# Patient Record
Sex: Male | Born: 1975
Health system: Southern US, Community
[De-identification: ages and names within clinical notes are randomized; demographics above are authoritative.]

## PROBLEM LIST (undated history)

## (undated) DIAGNOSIS — F32A Depression, unspecified: Secondary | ICD-10-CM

## (undated) DIAGNOSIS — M1990 Unspecified osteoarthritis, unspecified site: Secondary | ICD-10-CM

## (undated) DIAGNOSIS — F329 Major depressive disorder, single episode, unspecified: Secondary | ICD-10-CM

## (undated) DIAGNOSIS — I776 Arteritis, unspecified: Secondary | ICD-10-CM

## (undated) DIAGNOSIS — G589 Mononeuropathy, unspecified: Secondary | ICD-10-CM

## (undated) DIAGNOSIS — K5790 Diverticulosis of intestine, part unspecified, without perforation or abscess without bleeding: Secondary | ICD-10-CM

## (undated) DIAGNOSIS — M940 Chondrocostal junction syndrome [Tietze]: Secondary | ICD-10-CM

## (undated) DIAGNOSIS — K8020 Calculus of gallbladder without cholecystitis without obstruction: Secondary | ICD-10-CM

## (undated) HISTORY — DX: Major depressive disorder, single episode, unspecified: F32.9

## (undated) HISTORY — DX: Depression, unspecified: F32.A

## (undated) HISTORY — DX: Diverticulosis of intestine, part unspecified, without perforation or abscess without bleeding: K57.90

## (undated) HISTORY — DX: Unspecified osteoarthritis, unspecified site: M19.90

## (undated) HISTORY — DX: Arteritis, unspecified: I77.6

## (undated) HISTORY — DX: Chondrocostal junction syndrome (tietze): M94.0

## (undated) HISTORY — PX: KNEE SURGERY: SHX244

## (undated) HISTORY — DX: Mononeuropathy, unspecified: G58.9

## (undated) HISTORY — DX: Calculus of gallbladder without cholecystitis without obstruction: K80.20

---

## 1999-10-24 ENCOUNTER — Emergency Department (HOSPITAL_COMMUNITY): Admission: EM | Admit: 1999-10-24 | Discharge: 1999-10-24 | Payer: Self-pay | Admitting: Emergency Medicine

## 1999-11-07 ENCOUNTER — Emergency Department (HOSPITAL_COMMUNITY): Admission: EM | Admit: 1999-11-07 | Discharge: 1999-11-07 | Payer: Self-pay

## 2006-07-09 ENCOUNTER — Ambulatory Visit (HOSPITAL_BASED_OUTPATIENT_CLINIC_OR_DEPARTMENT_OTHER): Admission: RE | Admit: 2006-07-09 | Discharge: 2006-07-09 | Payer: Self-pay | Admitting: Orthopedic Surgery

## 2008-11-16 ENCOUNTER — Ambulatory Visit: Payer: Self-pay | Admitting: Family Medicine

## 2008-11-16 DIAGNOSIS — Z9189 Other specified personal risk factors, not elsewhere classified: Secondary | ICD-10-CM | POA: Insufficient documentation

## 2008-11-16 DIAGNOSIS — J309 Allergic rhinitis, unspecified: Secondary | ICD-10-CM | POA: Insufficient documentation

## 2008-11-16 DIAGNOSIS — G43909 Migraine, unspecified, not intractable, without status migrainosus: Secondary | ICD-10-CM | POA: Insufficient documentation

## 2008-12-28 ENCOUNTER — Ambulatory Visit: Payer: Self-pay | Admitting: Family Medicine

## 2008-12-28 LAB — CONVERTED CEMR LAB
ALT: 28 units/L (ref 0–53)
AST: 25 units/L (ref 0–37)
Alkaline Phosphatase: 64 units/L (ref 39–117)
BUN: 14 mg/dL (ref 6–23)
Bilirubin Urine: NEGATIVE
Bilirubin, Direct: 0.1 mg/dL (ref 0.0–0.3)
CO2: 29 meq/L (ref 19–32)
Calcium: 8.8 mg/dL (ref 8.4–10.5)
Chloride: 107 meq/L (ref 96–112)
Eosinophils Absolute: 0.1 10*3/uL (ref 0.0–0.7)
Eosinophils Relative: 2.4 % (ref 0.0–5.0)
GFR calc non Af Amer: 100.31 mL/min (ref >60)
HCT: 43.2 % (ref 39.0–52.0)
HDL: 53.8 mg/dL (ref >39.00)
Hemoglobin, Urine: NEGATIVE
Leukocytes, UA: NEGATIVE
Lymphocytes Relative: 36.4 % (ref 12.0–46.0)
Lymphs Abs: 1.9 10*3/uL (ref 0.7–4.0)
MCHC: 33.9 g/dL (ref 30.0–36.0)
Monocytes Absolute: 0.7 10*3/uL (ref 0.1–1.0)
Neutro Abs: 2.5 10*3/uL (ref 1.4–7.7)
Nitrite: NEGATIVE
Potassium: 4.2 meq/L (ref 3.5–5.1)
Sodium: 141 meq/L (ref 135–145)
Total Bilirubin: 0.8 mg/dL (ref 0.3–1.2)
Total Protein: 6.8 g/dL (ref 6.0–8.3)
Urine Glucose: NEGATIVE mg/dL
Urobilinogen, UA: 0.2 (ref 0.0–1.0)
VLDL: 15.4 mg/dL (ref 0.0–40.0)
WBC: 5.2 10*3/uL (ref 4.5–10.5)

## 2009-01-04 ENCOUNTER — Ambulatory Visit: Payer: Self-pay | Admitting: Family Medicine

## 2009-02-25 ENCOUNTER — Emergency Department (HOSPITAL_BASED_OUTPATIENT_CLINIC_OR_DEPARTMENT_OTHER): Admission: EM | Admit: 2009-02-25 | Discharge: 2009-02-25 | Payer: Self-pay | Admitting: Emergency Medicine

## 2009-02-25 ENCOUNTER — Ambulatory Visit: Payer: Self-pay | Admitting: Diagnostic Radiology

## 2009-02-25 ENCOUNTER — Telehealth: Payer: Self-pay | Admitting: Family Medicine

## 2009-08-14 ENCOUNTER — Emergency Department (HOSPITAL_BASED_OUTPATIENT_CLINIC_OR_DEPARTMENT_OTHER): Admission: EM | Admit: 2009-08-14 | Discharge: 2009-08-14 | Payer: Self-pay | Admitting: Emergency Medicine

## 2009-08-20 ENCOUNTER — Emergency Department (HOSPITAL_BASED_OUTPATIENT_CLINIC_OR_DEPARTMENT_OTHER): Admission: EM | Admit: 2009-08-20 | Discharge: 2009-08-20 | Payer: Self-pay | Admitting: Emergency Medicine

## 2010-09-23 NOTE — Op Note (Signed)
Casey Curtis, Casey Curtis                 ACCOUNT NO.:  [id number]   MEDICAL RECORD NO.:  [id number]          PATIENT TYPE:  AMB   LOCATION:  DSC                          FACILITY:  [facility]   PHYSICIAN:  Feliberto Gottron. Turner Daniels, M.D.   DATE OF BIRTH:  Jan 29, 1976   DATE OF PROCEDURE:  07/09/2006  DATE OF DISCHARGE:                               OPERATIVE REPORT   PREOPERATIVE DIAGNOSIS:  High-grade partial tear of the left patellar  tendon at the origin off the patella with scar tissue in an ossicle by  MRI scan.   POSTOPERATIVE DIAGNOSIS:  High-grade partial tear of the left patellar  tendon at the origin off the patella with scar tissue in an ossicle by  MRI scan.   PROCEDURE:  Resection of torn section of a left patellar tendon and  reinsertion using two #5 FiberWire whip stitches through drill holes in  the patella.   SURGEON:  Feliberto Gottron. Turner Daniels, M.D.   FIRST ASSISTANT:  Skip Mayer PA-C.   ANESTHETIC:  Femoral nerve block plus general LMA.   ESTIMATED BLOOD LOSS:  Minimal.   TOURNIQUET TIME:  40 minutes.   INDICATIONS FOR PROCEDURE:  35 year old gentleman who likes to play  recreational basketball and has had painful patellar tendons now for  well over and year and has been under the treatment of my associate Dr.  Eula Listen.  He has tried rest, physical therapy, anti-  inflammatory medicines, activity modification, but still has a  significant pain, recently had MRI scan showing a partial high-grade  tear of the left patellar tendon with a ossicle of bone in the substance  of the patellar tendon, has a larger ossicle on the right side but the  right side is not really causing much pain right now.  He has problems  limping with normal activities much less with playing basketball and  after putting up with the pain intermittently for a year, he desires  elective debridement of the patellar tendon and reinsertion with removal  of the intrasubstance ossicle.  Risks and benefits of  surgery discussed  with the patient.  He is prepared for intervention.   DESCRIPTION OF PROCEDURE:  The patient identified by armband, taken to  the operating room at [facility] day surgery center.  Appropriate anesthetic  monitors were attached and general LMA anesthesia induced with the  patient in supine position after the successful induction of the left  femoral nerve block.  Tourniquet applied high to the left thigh and left  lower extremity prepped, draped usual sterile fashion from the ankle to  the tourniquet.  The knee was then wrapped with an Esmarch bandage, bent  at 60 degrees and tourniquet inflated to 350 mmHg.  An anterior midline  incision starting at the middle of the patella and going distally to the  mid substance of the patellar tendon was made through the skin and  subcutaneous tissue and then the transverse retinaculum over the patella  was sectioned medially and laterally from 2 cm above the superior pole  of the patella to just below the tibial tubercle.  We immediately  identified the band of scar tissue at the origin of the patellar tendon.  Interestingly the medial lateral aspects of the patellar tendon were  intact.  We then made a crescent shaped transverse incision of the  tendon that excised the ossicle of bone which was easily palpable as  well as the nonlinear scar tissue where the high-grade tear was located  we went back to what appeared to be fresh tendon fibers.  We then  freshened up the tip of the patella with rongeurs and placed two  parallel drill holes with a one-eighth inch drill bit from distal to  proximal through the patella to allow passage of the #5 FiberWire  sutures.  We then placed two running whip stitches in the patellar  tendon, one on the lateral side going up and down with a #5 FiberWire  and one on the medial side going up and down with a #5 FiberWire.  The  limbs of the suture were then passed through the parallel drill holes  and  tied, reducing the normal tendon back down to the bleeding bed of  bone at the tip of the patella.  Once this was accomplished the knee  could easily be taken through range of motion with no gapping noted.  At  this point the tourniquet was let down,  small bleeders identified and  cauterized.  The transverse retinaculum closed with running #2 Vicryl  suture.  The subcutaneous tissue also closed with running #2 Vicryl  suture and the skin with 3-0 nylon suture.  A dressing of Xeroform 4x4  dressing sponges, Webril, Ace wrap and knee immobilizer was then  applied.  The patient was awakened and taken to the recovery room  without difficulty.      Feliberto Gottron. Turner Daniels, M.D.  Electronically Signed     FJR/MEDQ  D:  07/09/2006  T:  07/09/2006  Job:  [id number]

## 2011-08-30 ENCOUNTER — Ambulatory Visit: Payer: Self-pay | Admitting: Family Medicine

## 2011-09-27 ENCOUNTER — Encounter (HOSPITAL_BASED_OUTPATIENT_CLINIC_OR_DEPARTMENT_OTHER): Payer: Self-pay | Admitting: Emergency Medicine

## 2011-09-27 ENCOUNTER — Emergency Department (HOSPITAL_BASED_OUTPATIENT_CLINIC_OR_DEPARTMENT_OTHER)
Admission: EM | Admit: 2011-09-27 | Discharge: 2011-09-27 | Disposition: A | Discharge status: Home / Self Care | Payer: Managed Care, Other (non HMO) | Attending: Emergency Medicine | Admitting: Emergency Medicine

## 2011-09-27 DIAGNOSIS — S0180XA Unspecified open wound of other part of head, initial encounter: Secondary | ICD-10-CM | POA: Insufficient documentation

## 2011-09-27 DIAGNOSIS — W219XXA Striking against or struck by unspecified sports equipment, initial encounter: Secondary | ICD-10-CM | POA: Insufficient documentation

## 2011-09-27 DIAGNOSIS — Y9367 Activity, basketball: Secondary | ICD-10-CM | POA: Insufficient documentation

## 2011-09-27 DIAGNOSIS — S0542XA Penetrating wound of orbit with or without foreign body, left eye, initial encounter: Secondary | ICD-10-CM

## 2011-09-27 MED ORDER — LIDOCAINE-EPINEPHRINE 2 %-1:100000 IJ SOLN
INTRAMUSCULAR | Status: AC
  Filled 2011-09-27: qty 1

## 2011-09-27 MED ORDER — LIDOCAINE-EPINEPHRINE 2 %-1:100000 IJ SOLN
20.0000 mL | Freq: Once | INTRAMUSCULAR | Status: AC
  Administered 2011-09-27: 20 mL via INTRADERMAL

## 2011-09-27 NOTE — ED Provider Notes (Signed)
History     CSN: [id number]  Arrival date & time 09/27/11  2130   First MD Initiated Contact with Patient 09/27/11 [phone]      Chief Complaint  Patient presents with  . Facial Laceration    (Consider location/radiation/quality/duration/timing/severity/associated sxs/prior treatment) HPI This is a 36 year old black male who was playing basketball this morning. He was elbowed in the face and has a laceration along the anterolateral aspect of the left orbit. There was no loss of consciousness. He has not been vomiting. He has no visual changes. He denies neck pain. He stated it bled for about 20 minutes but became hemostatic with pressure. He is had a tetanus booster within the last 4 years.  History reviewed. No pertinent past medical history.  Past Surgical History  Procedure Date  . Knee surgery     No family history on file.  History  Substance Use Topics  . Smoking status: Never Smoker   . Smokeless tobacco: Not on file  . Alcohol Use: Yes      Review of Systems  All other systems reviewed and are negative.    Allergies  Review of patient's allergies indicates no known allergies.  Home Medications  No current outpatient prescriptions on file.  BP 125/74  Pulse 89  Temp(Src) 98 F (36.7 C) (Oral)  Resp 18  SpO2 99%  Physical Exam General: Well-developed, well-nourished male in no acute distress; appearance consistent with age of record HENT: normocephalic, laceration to the anteriolateral rim of left orbit Eyes: pupils equal round and reactive to light; extraocular muscles intact; no hyphema Neck: supple; no C-spine tenderness Heart: regular rate and rhythm Lungs: Normal respiratory effort and excursion Abdomen: soft; nondistended Extremities: No deformity; full range of motion; radial pulses normal; ankles in orthotic supports Neurologic: Awake, alert and oriented; motor function intact in all extremities and symmetric; no facial droop Skin: Warm and  dry Psychiatric: Normal mood and affect    ED Course  Procedures (including critical care time)  LACERATION REPAIR Performed by: Jemma Rasp L Authorized by: Hanley Seamen Consent: Verbal consent obtained. Risks and benefits: risks, benefits and alternatives were discussed Consent given by: patient Patient identity confirmed: provided demographic data Prepped and Draped in normal sterile fashion Wound explored  Laceration Location: Antero-lateral left orbital rim  Laceration Length: 2 cm  No Foreign Bodies seen or palpated  Anesthesia: local infiltration  Local anesthetic: lidocaine 2% with epinephrine  Anesthetic total: 1 ml  Irrigation method: syringe Amount of cleaning: standard  Skin closure: 6-0 Prolene   Number of sutures: Single   Technique: Running   Patient tolerance: Patient tolerated the procedure well with no immediate complications.    MDM  Sutures out in 5 days.         Hanley Seamen, MD 09/27/11 [phone]

## 2011-09-27 NOTE — ED Notes (Signed)
Pt as laceration above left eye from being elbowed while playing basketball

## 2011-10-25 ENCOUNTER — Encounter: Payer: Self-pay | Admitting: Family Medicine

## 2011-10-25 ENCOUNTER — Ambulatory Visit (INDEPENDENT_AMBULATORY_CARE_PROVIDER_SITE_OTHER): Payer: Managed Care, Other (non HMO) | Admitting: Family Medicine

## 2011-10-25 VITALS — BP 120/80 | Temp 98.2°F | Ht 65.5 in | Wt 199.0 lb

## 2011-10-25 DIAGNOSIS — Z23 Encounter for immunization: Secondary | ICD-10-CM

## 2011-10-25 DIAGNOSIS — N529 Male erectile dysfunction, unspecified: Secondary | ICD-10-CM

## 2011-10-25 DIAGNOSIS — Z0000 Encounter for general adult medical examination without abnormal findings: Secondary | ICD-10-CM

## 2011-10-25 LAB — CBC WITH DIFFERENTIAL/PLATELET
Eosinophils Relative: 0.7 % (ref 0.0–5.0)
Hemoglobin: 14.8 g/dL (ref 13.0–17.0)
Lymphocytes Relative: 28.5 % (ref 12.0–46.0)
Lymphs Abs: 1.7 10*3/uL (ref 0.7–4.0)
MCHC: 33.7 g/dL (ref 30.0–36.0)
MCV: 91.6 fl (ref 78.0–100.0)
Monocytes Absolute: 0.6 10*3/uL (ref 0.1–1.0)
Neutro Abs: 3.5 10*3/uL (ref 1.4–7.7)
RDW: 12.7 % (ref 11.5–14.6)
WBC: 5.8 10*3/uL (ref 4.5–10.5)

## 2011-10-25 LAB — BASIC METABOLIC PANEL
CO2: 25 mEq/L (ref 19–32)
Calcium: 8.9 mg/dL (ref 8.4–10.5)
Chloride: 103 mEq/L (ref 96–112)
GFR: 108.77 mL/min (ref >60.00)

## 2011-10-25 LAB — POCT URINALYSIS DIPSTICK
Bilirubin, UA: NEGATIVE
Glucose, UA: NEGATIVE
Ketones, UA: NEGATIVE
Nitrite, UA: NEGATIVE
Protein, UA: NEGATIVE

## 2011-10-25 LAB — HEPATIC FUNCTION PANEL
ALT: 29 U/L (ref 0–53)
Albumin: 4.4 g/dL (ref 3.5–5.2)
Bilirubin, Direct: 0.1 mg/dL (ref 0.0–0.3)

## 2011-10-25 LAB — TSH: TSH: 1.31 u[IU]/mL (ref 0.35–5.50)

## 2011-10-25 LAB — LIPID PANEL
LDL Cholesterol: 87 mg/dL (ref 0–99)
VLDL: 38.4 mg/dL (ref 0.0–40.0)

## 2011-10-25 MED ORDER — SILDENAFIL CITRATE 50 MG PO TABS: 50.0000 mg | ORAL_TABLET | ORAL | Status: DC | PRN

## 2011-10-25 NOTE — Patient Instructions (Addendum)
   Viagra 50 mg,,,,,,,,,,,, directions one half or one quarter of a tablet one to 2 hours prior to sex   Return when necessary yearly

## 2011-10-25 NOTE — Progress Notes (Signed)
  Subjective:    Patient ID: Casey Curtis, male    DOB: [birth year], 36 y.o.   MRN: [id number]  HPI Casey Curtis is a 36 year old married male nonsmoker who comes in today as a new patient to get reestablished. We last saw him about 4 years ago  He's always been in excellent health has had no chronic health problems. In 2008 he had a patellar tendon repair left knee at [facility] orthopedics.  Tetanus booster 2001 booster today  His only concern is some decreased sexual function he would like to discuss options. He's tried over-the-counter medicine to no avail.  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   HENT: Negative.   Eyes: Negative.   Respiratory: Negative.   Cardiovascular: Negative.   Gastrointestinal: Negative.   Genitourinary: Negative.   Musculoskeletal: Negative.   Skin: Negative.   Neurological: Negative.   Hematological: Negative.   Psychiatric/Behavioral: Negative.        Objective:   Physical Exam  Constitutional: He is oriented to person, place, and time. He appears well-developed and well-nourished.  HENT:  Head: Normocephalic and atraumatic.  Right Ear: External ear normal.  Left Ear: External ear normal.  Nose: Nose normal.  Mouth/Throat: Oropharynx is clear and moist.  Eyes: Conjunctivae and EOM are normal. Pupils are equal, round, and reactive to light.  Neck: Normal range of motion. Neck supple. No JVD present. No tracheal deviation present. No thyromegaly present.  Cardiovascular: Normal rate, regular rhythm, normal heart sounds and intact distal pulses.  Exam reveals no gallop and no friction rub.   No murmur heard. Pulmonary/Chest: Effort normal and breath sounds normal. No stridor. No respiratory distress. He has no wheezes. He has no rales. He exhibits no tenderness.  Abdominal: Soft. Bowel sounds are normal. He exhibits no distension and no mass. There is no tenderness. There is no rebound and no  guarding.  Genitourinary: Penis normal. No penile tenderness.  Musculoskeletal: Normal range of motion. He exhibits no edema and no tenderness.  Lymphadenopathy:    He has no cervical adenopathy.  Neurological: He is alert and oriented to person, place, and time. He has normal reflexes. No cranial nerve deficit. He exhibits normal muscle tone.  Skin: Skin is warm and dry. No rash noted. No erythema. No pallor.  Psychiatric: He has a normal mood and affect. His behavior is normal. Judgment and thought content normal.          Assessment & Plan:  Healthy male  Mild ED,,,,,,,,,,,,,, Viagra 50 one half tab when necessary

## 2014-02-05 ENCOUNTER — Ambulatory Visit (INDEPENDENT_AMBULATORY_CARE_PROVIDER_SITE_OTHER): Payer: Managed Care, Other (non HMO) | Admitting: Family Medicine

## 2014-02-05 ENCOUNTER — Encounter: Payer: Self-pay | Admitting: Family Medicine

## 2014-02-05 VITALS — BP 110/80 | Temp 98.0°F | Wt 212.0 lb

## 2014-02-05 DIAGNOSIS — Z23 Encounter for immunization: Secondary | ICD-10-CM

## 2014-02-05 DIAGNOSIS — R350 Frequency of micturition: Secondary | ICD-10-CM

## 2014-02-05 DIAGNOSIS — R300 Dysuria: Secondary | ICD-10-CM

## 2014-02-05 LAB — POCT URINALYSIS DIPSTICK
Bilirubin, UA: NEGATIVE
Glucose, UA: NEGATIVE
Ketones, UA: NEGATIVE
LEUKOCYTES UA: NEGATIVE
NITRITE UA: NEGATIVE
PROTEIN UA: NEGATIVE
RBC UA: NEGATIVE
Spec Grav, UA: 1.01
Urobilinogen, UA: 0.2
pH, UA: 6

## 2014-02-05 LAB — GLUCOSE, POCT (MANUAL RESULT ENTRY): POC GLUCOSE: 86 mg/dL (ref 70–99)

## 2014-02-05 NOTE — Progress Notes (Signed)
   Subjective:    Patient ID: Casey Curtis, male    DOB: [birth year], 38 y.o.   MRN: [id number]  HPI Casey BeeKevin is a 38 year old married male nonsmoker who comes in with a two-month history of urinary frequency and nocturia every 2 hours  He states he only drinks 2 cups of caffeinated beverages in the morning. No fever chills back pain etc.  Family history negative for prostrate problems   Review of Systems Review of systems otherwise negative    Objective:   Physical Exam Well-developed well-nourished male no acute distress vital signs stable he is afebrile examination of the genitalia is normal circumcised male rectal normal stool guaiac-negative prostate normal urinalysis normal       Assessment & Plan:  Urinary frequency.............. caffeine free diet.......Marland Kitchen. return when necessary..Marland Kitchen

## 2014-02-05 NOTE — Patient Instructions (Signed)
Caffeine free diet.......... call in 2-3 weeks if you do not see any improvement after being off the caffeine for couple weeks

## 2014-02-05 NOTE — Progress Notes (Signed)
Pre visit review using our clinic review tool, if applicable. No additional management support is needed unless otherwise documented below in the visit note. 

## 2018-09-21 ENCOUNTER — Emergency Department (HOSPITAL_BASED_OUTPATIENT_CLINIC_OR_DEPARTMENT_OTHER)
Admission: EM | Admit: 2018-09-21 | Discharge: 2018-09-21 | Disposition: A | Discharge status: Home / Self Care | Payer: 59 | Attending: Emergency Medicine | Admitting: Emergency Medicine

## 2018-09-21 ENCOUNTER — Other Ambulatory Visit: Payer: Self-pay

## 2018-09-21 ENCOUNTER — Encounter (HOSPITAL_BASED_OUTPATIENT_CLINIC_OR_DEPARTMENT_OTHER): Payer: Self-pay | Admitting: Emergency Medicine

## 2018-09-21 ENCOUNTER — Emergency Department (HOSPITAL_BASED_OUTPATIENT_CLINIC_OR_DEPARTMENT_OTHER): Payer: 59

## 2018-09-21 DIAGNOSIS — Y999 Unspecified external cause status: Secondary | ICD-10-CM | POA: Diagnosis not present

## 2018-09-21 DIAGNOSIS — Z23 Encounter for immunization: Secondary | ICD-10-CM | POA: Diagnosis not present

## 2018-09-21 DIAGNOSIS — Y9389 Activity, other specified: Secondary | ICD-10-CM | POA: Insufficient documentation

## 2018-09-21 DIAGNOSIS — Y92008 Other place in unspecified non-institutional (private) residence as the place of occurrence of the external cause: Secondary | ICD-10-CM | POA: Insufficient documentation

## 2018-09-21 DIAGNOSIS — S61314A Laceration without foreign body of right ring finger with damage to nail, initial encounter: Secondary | ICD-10-CM | POA: Insufficient documentation

## 2018-09-21 DIAGNOSIS — W274XXA Contact with kitchen utensil, initial encounter: Secondary | ICD-10-CM | POA: Diagnosis not present

## 2018-09-21 MED ORDER — TETANUS-DIPHTH-ACELL PERTUSSIS 5-2.5-18.5 LF-MCG/0.5 IM SUSP
0.5000 mL | Freq: Once | INTRAMUSCULAR | Status: AC
  Administered 2018-09-21: 0.5 mL via INTRAMUSCULAR
  Filled 2018-09-21: qty 0.5

## 2018-09-21 MED ORDER — BUPIVACAINE HCL 0.5 % IJ SOLN
50.0000 mL | Freq: Once | INTRAMUSCULAR | Status: AC
  Administered 2018-09-21: 20:00:00 50 mL
  Filled 2018-09-21: qty 1

## 2018-09-21 NOTE — ED Provider Notes (Signed)
[facility] EMERGENCY DEPARTMENT Provider Note   CSN: [id number] Arrival date & time: 09/21/18  1914    History   Chief Complaint Chief Complaint  Patient presents with  . Finger Injury    HPI Casey Curtis is a 43 y.o. male who presents for evaluation of right fourth finger laceration that occurred at about 7 PM this evening.  Patient reports he was using a mandolin to slice potatoes.  Patient states that his finger slipped and the mandolin caused a laceration of the dorsal aspect of his fourth finger.  Patient states that he has some numbness tingling sensation in the distal tip of the finger.  He reports no difficulty moving the finger.  He does not know when his last tetanus shot was.  He is not on any blood thinners.     The history is provided by the patient.    Past Medical History:  Diagnosis Date  . Depression     Patient Active Problem List   Diagnosis Date Noted  . Urinary frequency 02/05/2014  . ED (erectile dysfunction) 10/25/2011  . Routine general medical examination at a health care facility 10/25/2011  . MIGRAINE HEADACHE 11/16/2008  . ALLERGIC RHINITIS 11/16/2008  . CHEST WALL PAIN, HX OF 11/16/2008    Past Surgical History:  Procedure Laterality Date  . KNEE SURGERY          Home Medications    Prior to Admission medications   Medication Sig Start Date End Date Taking? Authorizing Provider  Ginkgo Biloba 40 MG TABS Take by mouth.    [provider]  sildenafil (VIAGRA) 50 MG tablet Take 1 tablet (50 mg total) by mouth as needed for erectile dysfunction. 10/25/11 11/24/11  Roderick Pee, MD    Family History Family History  Problem Relation Age of Onset  . Memory loss Father   . Arthritis Other   . Hyperlipidemia Other   . Stroke Other   . Hypertension Other   . Mental illness Other   . Diabetes Other     Social History Social History   Tobacco Use  . Smoking status: Never Smoker  Substance Use Topics  .  Alcohol use: Yes  . Drug use: No     Allergies   Augmentin [amoxicillin-pot clavulanate]   Review of Systems Review of Systems  Gastrointestinal: Negative for rectal pain and vomiting.  Skin: Positive for wound.  Neurological: Positive for numbness. Negative for weakness.  All other systems reviewed and are negative.    Physical Exam Updated Vital Signs BP (!) 165/104   Pulse 86   Temp (!) 97.5 F (36.4 C) (Oral)   Resp 20   Ht  (1.676 m)   Wt 83.9 kg   SpO2 100%   BMI 29.86 kg/m   Physical Exam Vitals signs and nursing note reviewed.  Constitutional:      Appearance: He is well-developed.  HENT:     Head: Normocephalic and atraumatic.  Eyes:     General: No scleral icterus.       Right eye: No discharge.        Left eye: No discharge.     Conjunctiva/sclera: Conjunctivae normal.  Cardiovascular:     Pulses:          Radial pulses are 2+ on the right side and 2+ on the left side.  Pulmonary:     Effort: Pulmonary effort is normal.  Musculoskeletal:     Comments: Flexion/tension of right fourth  digit intact without any difficulty.  He can flex and extend at the DIP without any difficulty.  Full flexion/tension of DIP intact when held in isolation.  He can easily make a fist.  Skin:    General: Skin is warm and dry.     Capillary Refill: Capillary refill takes less than 2 seconds.     Findings: Laceration present.     Comments: Good distal cap refill. RUE is not dusky in appearance or cool to touch.  Laceration to the dorsal aspect of the right fourth finger.  There is a small skin flap that starts just beyond the DIP and extends distally towards the proximal nailbed.  About three fourths of the nail is removed but there is no underlying laceration to the nailbed.  See photo below.  Neurological:     Mental Status: He is alert.     Comments: Patient initially reported some tingling sensation noted distal tip but on evaluation, he is able to have full  sensation when I evaluate the finger.  Psychiatric:        Speech: Speech normal.        Behavior: Behavior normal.                ED Treatments / Results  Labs (all labs ordered are listed, but only abnormal results are displayed) Labs Reviewed - No data to display  EKG None  Radiology Dg Finger Ring Right  Result Date: 09/21/2018 CLINICAL DATA:  Cut tip of finger on blade, sliced ring finger on vegetable mandolin. EXAM: RIGHT RING FINGER 2+V COMPARISON:  None. FINDINGS: Dressing overlies the fourth digit which limits assessment of the soft tissues. Suspected laceration about the distal tip. No radiopaque foreign body or fracture. Alignment and joint spaces are maintained. IMPRESSION: Dressing overlies the fourth digit which limits assessment of the soft tissues. No radiopaque foreign body or osseous abnormality. Electronically Signed   By: Narda RutherfordMelanie  Sanford M.D.   On: 09/21/2018 19:49    Procedures .Marland Kitchen.Laceration Repair Date/Time: 09/21/2018 10:30 PM Performed by: Maxwell CaulLayden, Xhaiden Coombs A, PA-C Authorized by: Maxwell CaulLayden, Kasey Hansell A, PA-C   Consent:    Consent obtained:  Verbal   Consent given by:  Patient   Risks discussed:  Infection, need for additional repair, pain, poor cosmetic result and poor wound healing   Alternatives discussed:  No treatment and delayed treatment Universal protocol:    Procedure explained and questions answered to patient or proxy's satisfaction: yes     Relevant documents present and verified: yes     Test results available and properly labeled: yes     Imaging studies available: yes     Required blood products, implants, devices, and special equipment available: yes     Site/side marked: yes     Immediately prior to procedure, a time out was called: yes     Patient identity confirmed:  Verbally with patient Anesthesia (see MAR for exact dosages):    Anesthesia method:  Local infiltration   Local anesthetic:  Bupivacaine 0.5% w/o epi Laceration  details:    Location:  Finger   Finger location:  R ring finger   Length (cm):  3 Repair type:    Repair type:  Intermediate Pre-procedure details:    Preparation:  Patient was prepped and draped in usual sterile fashion and imaging obtained to evaluate for foreign bodies Exploration:    Wound extent: no foreign bodies/material noted and no tendon damage noted   Treatment:    Area cleansed  with:  Betadine   Amount of cleaning:  Extensive   Irrigation solution:  Sterile saline   Irrigation method:  Pressure wash   Visualized foreign bodies/material removed: no   Skin repair:    Repair method:  Sutures   Suture size:  4-0   Wound skin closure material used: Vicryl rapide.   Suture technique:  Simple interrupted   Number of sutures:  8 Approximation:    Approximation:  Loose Post-procedure details:    Dressing:  Non-adherent dressing and bulky dressing   Patient tolerance of procedure:  Tolerated well, no immediate complications Comments:     Once the area was anesthetized, was thoroughly and extensively irrigated.  Evaluation of the wound showed no evidence of tendon damage, bony abnormality, foreign body.   (including critical care time)  Medications Ordered in ED Medications  bupivacaine (MARCAINE) 0.5 % (with pres) injection 50 mL (50 mLs Infiltration Given by Other 09/21/18 2026)  Tdap (BOOSTRIX) injection 0.5 mL (0.5 mLs Intramuscular Given 09/21/18 2027)     Initial Impression / Assessment and Plan / ED Course  I have reviewed the triage vital signs and the nursing notes.  Pertinent labs & imaging results that were available during my care of the patient were reviewed by me and considered in my medical decision making (see chart for details).        43 year old male who presents for evaluation of laceration to fourth digit of right hand that occurred approximately 7 PM this evening.  Reports he was using mandolin.  He reports some tingling sensation of the distal  aspect of the finger. Patient is afebrile, non-toxic appearing, sitting comfortably on examination table. Vital signs reviewed and stable.  On exam, he has full range of motion of the fourth digit without any difficulty.  He has full flexion/tension of DIP and PIP without any difficulty.  Initially had reported some tingling sensation in distal tip but on my evaluation, he has full sensation.  On exam, he has a laceration that appears to be a small skin flap starting just distal to the DIP and extends the proximal nailbed with three fourths of the nail removed.  Will plan for x-ray for evaluation of any bony abnormality.  Additionally, will plan to update patient's tetanus.  XR Reviewed.  No evidence of bony abnormality.  Discussed with Dr. Janee Morn (hand).  He recommends attempting to put the flap back in place with loose attachment.  Plan to follow-up with him on outpatient basis. Appreciate his help.   Laceration repaired as documented above.  Patient tolerated procedure well.  Encourage patient to at home supportive care measures.  Instructed patient to follow-up with hand as directed. At this time, patient exhibits no emergent life-threatening condition that require further evaluation in ED or admission.   Portions of this note were generated with Scientist, clinical (histocompatibility and immunogenetics). Dictation errors may occur despite best attempts at proofreading.    Final Clinical Impressions(s) / ED Diagnoses   Final diagnoses:  Laceration of right ring finger without foreign body with damage to nail, initial encounter    ED Discharge Orders    None       Rosana Hoes 09/21/18 2311    Tilden Fossa, MD 09/21/18 2316

## 2018-09-21 NOTE — ED Triage Notes (Signed)
Pt was working with a Mandelin blade and cut left ring finger aprrox 15 minutes ago. States he has not feeling. Wrapped in triage.

## 2018-09-21 NOTE — ED Notes (Signed)
ED Provider at bedside to suture  

## 2018-09-21 NOTE — ED Notes (Signed)
pts finger was dressed during triage. By time triage was over patient was bleeding through dressing another dressing applied with pressure wrapping

## 2018-09-21 NOTE — Discharge Instructions (Signed)
Keep the wound covered.  If it keeps bleeding, you can change the top dressing but leave the yellow gauze on.  You can take Tylenol or Ibuprofen as directed for pain. You can alternate Tylenol and Ibuprofen every 4 hours for additional pain relief.   Keep it elevated.  Follow-up with Dr. Janee Morn as directed.   Monitor closely for any signs of infection. Return to the Emergency Department for any worsening redness/swelling of the area that begins to spread, drainage from the site, worsening pain, fever or any other worsening or concerning symptoms.

## 2019-03-14 ENCOUNTER — Encounter: Payer: Self-pay | Admitting: Medical

## 2019-03-14 ENCOUNTER — Other Ambulatory Visit: Payer: Self-pay

## 2019-03-14 ENCOUNTER — Ambulatory Visit (INDEPENDENT_AMBULATORY_CARE_PROVIDER_SITE_OTHER): Payer: BC Managed Care – PPO | Admitting: Medical

## 2019-03-14 VITALS — BP 114/68 | HR 63 | Temp 94.7°F | Resp 16 | Ht 67.0 in | Wt 186.8 lb

## 2019-03-14 DIAGNOSIS — Z0000 Encounter for general adult medical examination without abnormal findings: Secondary | ICD-10-CM | POA: Diagnosis not present

## 2019-03-14 DIAGNOSIS — Z23 Encounter for immunization: Secondary | ICD-10-CM | POA: Diagnosis not present

## 2019-03-14 LAB — COMPREHENSIVE METABOLIC PANEL
ALT: 35 U/L (ref 0–53)
AST: 22 U/L (ref 0–37)
Albumin: 4.4 g/dL (ref 3.5–5.2)
Alkaline Phosphatase: 49 U/L (ref 39–117)
BUN: 16 mg/dL (ref 6–23)
CO2: 31 mEq/L (ref 19–32)
Calcium: 9 mg/dL (ref 8.4–10.5)
Chloride: 106 mEq/L (ref 96–112)
Creatinine, Ser: 1.09 mg/dL (ref 0.40–1.50)
GFR: 90.05 mL/min (ref >60.00)
Glucose, Bld: 101 mg/dL — ABNORMAL HIGH (ref 70–99)
Potassium: 4.4 mEq/L (ref 3.5–5.1)
Sodium: 143 mEq/L (ref 135–145)
Total Bilirubin: 0.6 mg/dL (ref 0.2–1.2)
Total Protein: 6.5 g/dL (ref 6.0–8.3)

## 2019-03-14 LAB — CBC WITH DIFFERENTIAL/PLATELET
Basophils Absolute: 0 10*3/uL (ref 0.0–0.1)
Basophils Relative: 0.7 % (ref 0.0–3.0)
Eosinophils Absolute: 0.1 10*3/uL (ref 0.0–0.7)
Eosinophils Relative: 1.2 % (ref 0.0–5.0)
HCT: 43.8 % (ref 39.0–52.0)
Hemoglobin: 14.5 g/dL (ref 13.0–17.0)
Lymphocytes Relative: 34.1 % (ref 12.0–46.0)
Lymphs Abs: 1.7 10*3/uL (ref 0.7–4.0)
MCHC: 33.2 g/dL (ref 30.0–36.0)
MCV: 94.6 fl (ref 78.0–100.0)
Monocytes Absolute: 0.6 10*3/uL (ref 0.1–1.0)
Monocytes Relative: 12.1 % — ABNORMAL HIGH (ref 3.0–12.0)
Neutro Abs: 2.6 10*3/uL (ref 1.4–7.7)
Neutrophils Relative %: 51.9 % (ref 43.0–77.0)
Platelets: 228 10*3/uL (ref 150.0–400.0)
RBC: 4.63 Mil/uL (ref 4.22–5.81)
RDW: 13 % (ref 11.5–15.5)
WBC: 5 10*3/uL (ref 4.0–10.5)

## 2019-03-14 LAB — LIPID PANEL
Cholesterol: 209 mg/dL — ABNORMAL HIGH (ref 0–200)
HDL: 77.3 mg/dL (ref >39.00)
LDL Cholesterol: 116 mg/dL — ABNORMAL HIGH (ref 0–99)
NonHDL: 131.91
Total CHOL/HDL Ratio: 3
Triglycerides: 81 mg/dL (ref 0.0–149.0)
VLDL: 16.2 mg/dL (ref 0.0–40.0)

## 2019-03-14 NOTE — Progress Notes (Signed)
Subjective:    Patient ID: Casey Curtis, male    DOB: 1975-09-04, 43 y.o.   MRN: [id number]  HPI  Pt in for first time.  Pt states he has no known medical problems. He states needs wellness exam. 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 Pt has 2 children. Married. Pt is active doing things around house. Pt states eats semi healthy. Non smoker. He does drinks etoh on weekend. 2-3 weeks.Pt enjoys watching football.  Pt will get flu vaccine.  Review of Systems  Constitutional: Negative for chills and fatigue.  HENT: Negative for congestion, ear discharge, facial swelling, mouth sores, rhinorrhea, sinus pressure and sinus pain.   Respiratory: Negative for cough, chest tightness, shortness of breath and wheezing.   Cardiovascular: Negative for chest pain and palpitations.  Gastrointestinal: Negative for abdominal pain, blood in stool and constipation.  Genitourinary: Negative for difficulty urinating, discharge, dysuria, frequency, penile swelling, scrotal swelling and urgency.  Musculoskeletal: Negative for back pain.  Neurological: Negative for dizziness, numbness and headaches.  Hematological: Negative for adenopathy. Does not bruise/bleed easily.  Psychiatric/Behavioral: Negative for behavioral problems and confusion. The patient is not nervous/anxious.      Past Medical History:  Diagnosis Date  . Depression      Social History   Socioeconomic History  . Marital status: Married    Spouse name: Not on file  . Number of children: Not on file  . Years of education: Not on file  . Highest education level: Not on file  Occupational History  . Not on file  Social Needs  . Financial resource strain: Not on file  . Food insecurity    Worry: Not on file    Inability: Not on file  . Transportation needs    Medical: Not on file    Non-medical: Not on file  Tobacco Use  . Smoking status: Never Smoker  . Smokeless tobacco:  Former Engineer, water and Sexual Activity  . Alcohol use: Yes  . Drug use: No  . Sexual activity: Not on file  Lifestyle  . Physical activity    Days per week: Not on file    Minutes per session: Not on file  . Stress: Not on file  Relationships  . Social Musician on phone: Not on file    Gets together: Not on file    Attends religious service: Not on file    Active member of club or organization: Not on file    Attends meetings of clubs or organizations: Not on file    Relationship status: Not on file  . Intimate partner violence    Fear of current or ex partner: Not on file    Emotionally abused: Not on file    Physically abused: Not on file    Forced sexual activity: Not on file  Other Topics Concern  . Not on file  Social History Narrative  . Not on file    Past Surgical History:  Procedure Laterality Date  . KNEE SURGERY      Family History  Problem Relation Age of Onset  . Memory loss Father   . Arthritis Other   . Hyperlipidemia Other   . Stroke Other   . Hypertension Other   . Mental illness Other   . Diabetes Other     Allergies  Allergen Reactions  . Augmentin [Amoxicillin-Pot Clavulanate]     Current Outpatient Medications on File  Prior to Visit  Medication Sig Dispense Refill  . Ginkgo Biloba 40 MG TABS Take by mouth.     No current facility-administered medications on file prior to visit.     BP 114/68   Pulse 63   Temp (!) 94.7 F (34.8 C) (Temporal)   Resp 16   Ht 5\' 7"  (1.702 m)   Wt 186 lb 12.8 oz (84.7 kg)   SpO2 100%   BMI 29.26 kg/m       Objective:   Physical Exam  General Mental Status- Alert. General Appearance- Not in acute distress.   Skin General: Color- Normal Color. Moisture- Normal Moisture.  Neck Carotid Arteries- Normal color. Moisture- Normal Moisture. No carotid bruits. No JVD.  Chest and Lung Exam Auscultation: Breath Sounds:-Normal.  Cardiovascular Auscultation:Rythm- Regular.  Murmurs & Other Heart Sounds:Auscultation of the heart reveals- No Murmurs.  Abdomen Inspection:-Inspeection Normal. Palpation/Percussion:Note:No mass. Palpation and Percussion of the abdomen reveal- Non Tender, Non Distended + BS, no rebound or guarding.   Neurologic Cranial Nerve exam:- CN III-XII intact(No nystagmus), symmetric smile. Strength:- 5/5 equal and symmetric strength both upper and lower extremities.      Assessment & Plan:  For you wellness exam today I have ordered cbc, cmp, lipid panel  and hiv.  Flu vaccine today.  Recommend exercise and healthy diet.  We will let you know lab results as they come in.  Follow up date appointment will be determined after lab review.     Mackie Pai, PA-C

## 2019-03-14 NOTE — Patient Instructions (Addendum)
For you wellness exam today I have ordered cbc, cmp, lipid panel  and hiv.  Flu vaccine today.  Recommend exercise and healthy diet.  We will let you know lab results as they come in.  Follow up date appointment will be determined after lab review.     Preventive Care 41-41 Years Old, Male Preventive care refers to lifestyle choices and visits with your health care provider that can promote health and wellness. This includes:  A yearly physical exam. This is also called an annual well check.  Regular dental and eye exams.  Immunizations.  Screening for certain conditions.  Healthy lifestyle choices, such as eating a healthy diet, getting regular exercise, not using drugs or products that contain nicotine and tobacco, and limiting alcohol use. What can I expect for my preventive care visit? Physical exam Your health care provider will check:  Height and weight. These may be used to calculate body mass index (BMI), which is a measurement that tells if you are at a healthy weight.  Heart rate and blood pressure.  Your skin for abnormal spots. Counseling Your health care provider may ask you questions about:  Alcohol, tobacco, and drug use.  Emotional well-being.  Home and relationship well-being.  Sexual activity.  Eating habits.  Work and work Statistician. What immunizations do I need?  Influenza (flu) vaccine  This is recommended every year. Tetanus, diphtheria, and pertussis (Tdap) vaccine  You may need a Td booster every 10 years. Varicella (chickenpox) vaccine  You may need this vaccine if you have not already been vaccinated. Zoster (shingles) vaccine  You may need this after age 35. Measles, mumps, and rubella (MMR) vaccine  You may need at least one dose of MMR if you were born in 1957 or later. You may also need a second dose. Pneumococcal conjugate (PCV13) vaccine  You may need this if you have certain conditions and were not previously  vaccinated. Pneumococcal polysaccharide (PPSV23) vaccine  You may need one or two doses if you smoke cigarettes or if you have certain conditions. Meningococcal conjugate (MenACWY) vaccine  You may need this if you have certain conditions. Hepatitis A vaccine  You may need this if you have certain conditions or if you travel or work in places where you may be exposed to hepatitis A. Hepatitis B vaccine  You may need this if you have certain conditions or if you travel or work in places where you may be exposed to hepatitis B. Haemophilus influenzae type b (Hib) vaccine  You may need this if you have certain risk factors. Human papillomavirus (HPV) vaccine  If recommended by your health care provider, you may need three doses over 6 months. You may receive vaccines as individual doses or as more than one vaccine together in one shot (combination vaccines). Talk with your health care provider about the risks and benefits of combination vaccines. What tests do I need? Blood tests  Lipid and cholesterol levels. These may be checked every 5 years, or more frequently if you are over 64 years old.  Hepatitis C test.  Hepatitis B test. Screening  Lung cancer screening. You may have this screening every year starting at age 54 if you have a 30-pack-year history of smoking and currently smoke or have quit within the past 15 years.  Prostate cancer screening. Recommendations will vary depending on your family history and other risks.  Colorectal cancer screening. All adults should have this screening starting at age 36 and continuing until  age 36. Your health care provider may recommend screening at age 45 if you are at increased risk. You will have tests every 1-10 years, depending on your results and the type of screening test.  Diabetes screening. This is done by checking your blood sugar (glucose) after you have not eaten for a while (fasting). You may have this done every 1-3  years.  Sexually transmitted disease (STD) testing. Follow these instructions at home: Eating and drinking  Eat a diet that includes fresh fruits and vegetables, whole grains, lean protein, and low-fat dairy products.  Take vitamin and mineral supplements as recommended by your health care provider.  Do not drink alcohol if your health care provider tells you not to drink.  If you drink alcohol: ? Limit how much you have to 0-2 drinks a day. ? Be aware of how much alcohol is in your drink. In the [place] one drink equals one 12 oz bottle of beer (355 mL), one 5 oz glass of wine (148 mL), or one 1 oz glass of hard liquor (44 mL). Lifestyle  Take daily care of your teeth and gums.  Stay active. Exercise for at least 30 minutes on 5 or more days each week.  Do not use any products that contain nicotine or tobacco, such as cigarettes, e-cigarettes, and chewing tobacco. If you need help quitting, ask your health care provider.  If you are sexually active, practice safe sex. Use a condom or other form of protection to prevent STIs (sexually transmitted infections).  Talk with your health care provider about taking a low-dose aspirin every day starting at age 2. What's next?  Go to your health care provider once a year for a well check visit.  Ask your health care provider how often you should have your eyes and teeth checked.  Stay up to date on all vaccines. This information is not intended to replace advice given to you by your health care provider. Make sure you discuss any questions you have with your health care provider. Document Released: 05/21/2015 Document Revised: 04/18/2018 Document Reviewed: 04/18/2018 Elsevier Patient Education  2020 [employer].

## 2019-03-14 NOTE — Addendum Note (Signed)
Addended by: Hinton Dyer on: 03/14/2019 01:01 PM   Modules accepted: Orders

## 2019-03-15 LAB — HIV ANTIBODY (ROUTINE TESTING W REFLEX): HIV 1&2 Ab, 4th Generation: NONREACTIVE

## 2019-11-04 ENCOUNTER — Other Ambulatory Visit: Payer: Self-pay

## 2019-11-04 ENCOUNTER — Ambulatory Visit (HOSPITAL_BASED_OUTPATIENT_CLINIC_OR_DEPARTMENT_OTHER)
Admission: RE | Admit: 2019-11-04 | Discharge: 2019-11-04 | Disposition: A | Discharge status: Home / Self Care | Payer: BC Managed Care – PPO | Source: Ambulatory Visit | Attending: Medical | Admitting: Medical

## 2019-11-04 ENCOUNTER — Other Ambulatory Visit: Payer: Self-pay | Admitting: Medical

## 2019-11-04 ENCOUNTER — Ambulatory Visit (INDEPENDENT_AMBULATORY_CARE_PROVIDER_SITE_OTHER): Payer: BC Managed Care – PPO | Admitting: Medical

## 2019-11-04 ENCOUNTER — Encounter: Payer: Self-pay | Admitting: Medical

## 2019-11-04 ENCOUNTER — Ambulatory Visit (HOSPITAL_BASED_OUTPATIENT_CLINIC_OR_DEPARTMENT_OTHER): Payer: BC Managed Care – PPO

## 2019-11-04 VITALS — BP 146/90 | HR 64 | Resp 18 | Ht 67.0 in | Wt 196.8 lb

## 2019-11-04 DIAGNOSIS — N50812 Left testicular pain: Secondary | ICD-10-CM

## 2019-11-04 DIAGNOSIS — N529 Male erectile dysfunction, unspecified: Secondary | ICD-10-CM | POA: Diagnosis not present

## 2019-11-04 DIAGNOSIS — N433 Hydrocele, unspecified: Secondary | ICD-10-CM | POA: Diagnosis not present

## 2019-11-04 MED ORDER — CIPROFLOXACIN HCL 500 MG PO TABS: 500.0000 mg | ORAL_TABLET | Freq: Two times a day (BID) | ORAL | 0 refills | Status: DC

## 2019-11-04 MED ORDER — SILDENAFIL CITRATE 25 MG PO TABS: 25.0000 mg | ORAL_TABLET | Freq: Every day | ORAL | 0 refills | Status: DC | PRN

## 2019-11-04 NOTE — Patient Instructions (Addendum)
For left testicle pain will order US scrotum/testicle. Please go down and get scheduled for that today.  Will rx cipro antibiotic for probable epidymitis.  For ED will prescribe low dose viagra low dose. Let me know if you get nasal congestion or other symptoms.  Schedule cpe/wellness can get testosterone level at that time.  Follow up 3-4 weeks or sooner

## 2019-11-04 NOTE — Progress Notes (Signed)
Subjective:    Patient ID: Casey Curtis, male    DOB: 1975/10/26, 44 y.o.   MRN: [id number]  HPI  Pt in for some testicle pain with lump since saturday. Pt states he feels pee sized lump. Also some suprapubic pain. Some suprapubic pain.   No fever,no chills and no dc from penis.   Pt has some ED recently. Pt states years ago he had nasal congestion with viagra. No vision changes, no ha or dizziness. When use viagra nasal congestion only last one day and very mild. No sob or wheezing.   Review of Systems  Constitutional: Negative for chills, fatigue and fever.  Respiratory: Negative for cough, chest tightness, shortness of breath and wheezing.   Cardiovascular: Negative for chest pain and palpitations.  Gastrointestinal: Negative for abdominal pain.  Genitourinary: Positive for testicular pain. Negative for difficulty urinating, discharge, dysuria, frequency, penile pain and scrotal swelling.       Low libido.   ED.  Musculoskeletal: Negative for back pain.  Skin: Negative for rash.  Neurological: Negative for dizziness.  Hematological: Negative for adenopathy. Does not bruise/bleed easily.  Psychiatric/Behavioral: Negative for behavioral problems and confusion.   Past Medical History:  Diagnosis Date  . Depression      Social History   Socioeconomic History  . Marital status: Married    Spouse name: Not on file  . Number of children: Not on file  . Years of education: Not on file  . Highest education level: Not on file  Occupational History  . Not on file  Tobacco Use  . Smoking status: Never Smoker  . Smokeless tobacco: Former Engineer, water and Sexual Activity  . Alcohol use: Yes  . Drug use: No  . Sexual activity: Not on file  Other Topics Concern  . Not on file  Social History Narrative  . Not on file   Social Determinants of Health   Financial Resource Strain:   . Difficulty of Paying Living Expenses:   Food Insecurity:   . Worried About Patent examiner in the Last Year:   . Barista in the Last Year:   Transportation Needs:   . Freight forwarder (Medical):   Marland Kitchen Lack of Transportation (Non-Medical):   Physical Activity:   . Days of Exercise per Week:   . Minutes of Exercise per Session:   Stress:   . Feeling of Stress :   Social Connections:   . Frequency of Communication with Friends and Family:   . Frequency of Social Gatherings with Friends and Family:   . Attends Religious Services:   . Active Member of Clubs or Organizations:   . Attends Banker Meetings:   Marland Kitchen Marital Status:   Intimate Partner Violence:   . Fear of Current or Ex-Partner:   . Emotionally Abused:   Marland Kitchen Physically Abused:   . Sexually Abused:     Past Surgical History:  Procedure Laterality Date  . KNEE SURGERY      Family History  Problem Relation Age of Onset  . Memory loss Father   . Arthritis Other   . Hyperlipidemia Other   . Stroke Other   . Hypertension Other   . Mental illness Other   . Diabetes Other     Allergies  Allergen Reactions  . Augmentin [Amoxicillin-Pot Clavulanate]     Current Outpatient Medications on File Prior to Visit  Medication Sig Dispense Refill  . Ginkgo Biloba 40 MG TABS  Take by mouth. (Patient not taking: Reported on 11/04/2019)     No current facility-administered medications on file prior to visit.    BP (!) 146/90 (BP Location: Left Arm, Patient Position: Sitting, Cuff Size: Large)   Pulse 64   Resp 18   Ht 5\' 7"  (1.702 m)   Wt 196 lb 12.8 oz (89.3 kg)   SpO2 96%   BMI 30.82 kg/m       Objective:   Physical Exam   General- No acute distress. Pleasant patient. Neck- Full range of motion, no jvd Lungs- Clear, even and unlabored. Heart- regular rate and rhythm. Neurologic- CNII- XII grossly intact.  Genital- left side testicle lump in epidymis area. Rt side is normal.     Assessment & Plan:  For left testicle pain will order scrotum/testicle. Please go down  and get scheduled for that today.  Will rx cipro antibiotic for probable epidymitis.  Schedule cpe/wellness can get testosterone level at that time.  For ED will prescribe low dose viagra low dose. Let me know if you get nasal congestion or other symptoms.  Follow up 3-4 weeks or sooner  [place], PA-C   Time spent with patient cre  today was  30 minutes which consisted of chart review, discussing diagnosis, work up, imaging review, treatment and documentation.

## 2019-11-06 ENCOUNTER — Encounter: Payer: Self-pay | Admitting: Medical

## 2019-11-17 ENCOUNTER — Encounter: Payer: Self-pay | Admitting: Medical

## 2019-11-18 ENCOUNTER — Telehealth: Payer: Self-pay | Admitting: Medical

## 2019-11-18 DIAGNOSIS — N50812 Left testicular pain: Secondary | ICD-10-CM

## 2019-11-18 DIAGNOSIS — N5089 Other specified disorders of the male genital organs: Secondary | ICD-10-CM

## 2019-11-18 NOTE — Telephone Encounter (Signed)
Referral to urologist placed. 

## 2019-11-21 ENCOUNTER — Telehealth: Payer: Self-pay | Admitting: Medical

## 2019-11-21 MED ORDER — SERTRALINE HCL 25 MG PO TABS
25.0000 mg | ORAL_TABLET | Freq: Every day | ORAL | 1 refills | Status: DC
Start: 2019-11-21 — End: 2020-01-19

## 2019-11-21 NOTE — Telephone Encounter (Signed)
Rx sertraline sent to pt pharmacy. 

## 2019-11-26 ENCOUNTER — Encounter: Payer: Self-pay | Admitting: Medical

## 2019-11-26 ENCOUNTER — Ambulatory Visit (INDEPENDENT_AMBULATORY_CARE_PROVIDER_SITE_OTHER): Payer: BC Managed Care – PPO | Admitting: Medical

## 2019-11-26 ENCOUNTER — Other Ambulatory Visit: Payer: Self-pay

## 2019-11-26 VITALS — BP 124/72 | HR 71 | Temp 98.1°F | Resp 20 | Ht 67.0 in | Wt 189.6 lb

## 2019-11-26 DIAGNOSIS — F524 Premature ejaculation: Secondary | ICD-10-CM | POA: Diagnosis not present

## 2019-11-26 DIAGNOSIS — N5089 Other specified disorders of the male genital organs: Secondary | ICD-10-CM

## 2019-11-26 DIAGNOSIS — N529 Male erectile dysfunction, unspecified: Secondary | ICD-10-CM

## 2019-11-26 DIAGNOSIS — R6882 Decreased libido: Secondary | ICD-10-CM | POA: Diagnosis not present

## 2019-11-26 DIAGNOSIS — Z0000 Encounter for general adult medical examination without abnormal findings: Secondary | ICD-10-CM

## 2019-11-26 DIAGNOSIS — Z113 Encounter for screening for infections with a predominantly sexual mode of transmission: Secondary | ICD-10-CM | POA: Diagnosis not present

## 2019-11-26 DIAGNOSIS — Z0001 Encounter for general adult medical examination with abnormal findings: Secondary | ICD-10-CM | POA: Diagnosis not present

## 2019-11-26 DIAGNOSIS — Z125 Encounter for screening for malignant neoplasm of prostate: Secondary | ICD-10-CM

## 2019-11-26 LAB — CBC WITH DIFFERENTIAL/PLATELET
Basophils Absolute: 0 10*3/uL (ref 0.0–0.1)
Basophils Relative: 0.6 % (ref 0.0–3.0)
Eosinophils Absolute: 0.1 10*3/uL (ref 0.0–0.7)
Eosinophils Relative: 1.5 % (ref 0.0–5.0)
HCT: 45.4 % (ref 39.0–52.0)
Hemoglobin: 15.4 g/dL (ref 13.0–17.0)
Lymphocytes Relative: 32.5 % (ref 12.0–46.0)
Lymphs Abs: 1.8 10*3/uL (ref 0.7–4.0)
MCHC: 33.9 g/dL (ref 30.0–36.0)
MCV: 92.7 fl (ref 78.0–100.0)
Monocytes Absolute: 0.7 10*3/uL (ref 0.1–1.0)
Monocytes Relative: 11.8 % (ref 3.0–12.0)
Neutro Abs: 3 10*3/uL (ref 1.4–7.7)
Neutrophils Relative %: 53.6 % (ref 43.0–77.0)
Platelets: 232 10*3/uL (ref 150.0–400.0)
RBC: 4.89 Mil/uL (ref 4.22–5.81)
RDW: 12.9 % (ref 11.5–15.5)
WBC: 5.6 10*3/uL (ref 4.0–10.5)

## 2019-11-26 LAB — LIPID PANEL
Cholesterol: 226 mg/dL — ABNORMAL HIGH (ref 0–200)
HDL: 72.3 mg/dL (ref >39.00)
LDL Cholesterol: 131 mg/dL — ABNORMAL HIGH (ref 0–99)
NonHDL: 153.85
Total CHOL/HDL Ratio: 3
Triglycerides: 116 mg/dL (ref 0.0–149.0)
VLDL: 23.2 mg/dL (ref 0.0–40.0)

## 2019-11-26 LAB — PSA: PSA: 1.33 ng/mL (ref 0.10–4.00)

## 2019-11-26 LAB — COMPREHENSIVE METABOLIC PANEL
ALT: 38 U/L (ref 0–53)
AST: 20 U/L (ref 0–37)
Albumin: 4.6 g/dL (ref 3.5–5.2)
Alkaline Phosphatase: 53 U/L (ref 39–117)
BUN: 9 mg/dL (ref 6–23)
CO2: 30 mEq/L (ref 19–32)
Calcium: 9.5 mg/dL (ref 8.4–10.5)
Chloride: 105 mEq/L (ref 96–112)
Creatinine, Ser: 1.13 mg/dL (ref 0.40–1.50)
GFR: 86.09 mL/min (ref >60.00)
Glucose, Bld: 99 mg/dL (ref 70–99)
Potassium: 4.3 mEq/L (ref 3.5–5.1)
Sodium: 140 mEq/L (ref 135–145)
Total Bilirubin: 0.9 mg/dL (ref 0.2–1.2)
Total Protein: 7.1 g/dL (ref 6.0–8.3)

## 2019-11-26 NOTE — Progress Notes (Addendum)
Subjective:    Patient ID: Casey Curtis, male    DOB: 22-Nov-1975, 44 y.o.   MRN: [id number]  HPI     Pt also wants cpe. We followed up and discussed last visit concerns below.  Pt states he has no known medical problems. He states needs wellness exam. 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 Pt has 2 children. Married. Pt is active doing things around house. Pt states eats semi healthy. Non smoker. He does drinks etoh on weekend. 2-3 weeks.Pt enjoys watching football.   Pt in for follow up. He has urologist appointment for Monday for 10 x 8 x 9 mm heterogeneous mass with an area of mild Hyperechogenicity.  Pt also recently sent me a note by my chart about recent premature ejaculation. He states off and on for years. More frequent past several months. I sent him in low dose of sertraline. He states not sure if strained relationship with wife causing. Pt states also not having relations frequently with wife. More rare recently.    Pt admits also some depression. His dad passed away1 years ago. Also wife unfaithful twice in past 5 years. Wife situation depresses him. Challenges since wife is bipoloar. They do plan to attend counseling.   Review of Systems  Constitutional: Negative for chills and fatigue.  Respiratory: Negative for cough, chest tightness and wheezing.   Cardiovascular: Negative for chest pain and palpitations.  Gastrointestinal: Negative for abdominal pain.  Musculoskeletal: Negative for back pain.  Psychiatric/Behavioral: Positive for dysphoric mood. Negative for behavioral problems, confusion and suicidal ideas.       See hpi.     Past Medical History:  Diagnosis Date  . Depression      Social History   Socioeconomic History  . Marital status: Married    Spouse name: Not on file  . Number of children: Not on file  . Years of education: Not on file  . Highest education level: Not on file  Occupational  History  . Not on file  Tobacco Use  . Smoking status: Never Smoker  . Smokeless tobacco: Former Engineer, water and Sexual Activity  . Alcohol use: Yes  . Drug use: No  . Sexual activity: Not on file  Other Topics Concern  . Not on file  Social History Narrative  . Not on file   Social Determinants of Health   Financial Resource Strain:   . Difficulty of Paying Living Expenses:   Food Insecurity:   . Worried About Programme researcher, broadcasting/film/video in the Last Year:   . Barista in the Last Year:   Transportation Needs:   . Freight forwarder (Medical):   Marland Kitchen Lack of Transportation (Non-Medical):   Physical Activity:   . Days of Exercise per Week:   . Minutes of Exercise per Session:   Stress:   . Feeling of Stress :   Social Connections:   . Frequency of Communication with Friends and Family:   . Frequency of Social Gatherings with Friends and Family:   . Attends Religious Services:   . Active Member of Clubs or Organizations:   . Attends Banker Meetings:   Marland Kitchen Marital Status:   Intimate Partner Violence:   . Fear of Current or Ex-Partner:   . Emotionally Abused:   Marland Kitchen Physically Abused:   . Sexually Abused:     Past Surgical History:  Procedure Laterality Date  . KNEE  SURGERY      Family History  Problem Relation Age of Onset  . Memory loss Father   . Arthritis Other   . Hyperlipidemia Other   . Stroke Other   . Hypertension Other   . Mental illness Other   . Diabetes Other     Allergies  Allergen Reactions  . Augmentin [Amoxicillin-Pot Clavulanate]     Current Outpatient Medications on File Prior to Visit  Medication Sig Dispense Refill  . Ginkgo Biloba 40 MG TABS Take by mouth.     . sertraline (ZOLOFT) 25 MG tablet Take 1 tablet (25 mg total) by mouth daily. 30 tablet 1  . sildenafil (VIAGRA) 25 MG tablet Take 1 tablet (25 mg total) by mouth daily as needed for erectile dysfunction. 10 tablet 0  . ciprofloxacin (CIPRO) 500 MG tablet Take  1 tablet (500 mg total) by mouth 2 (two) times daily. (Patient not taking: Reported on 11/26/2019) 20 tablet 0   No current facility-administered medications on file prior to visit.    BP 124/72   Pulse 71   Temp 98.1 F (36.7 C) (Oral)   Resp 20   Ht 5\' 7"  (1.702 m)   Wt 189 lb 9.6 oz (86 kg)   SpO2 98%   BMI 29.70 kg/m       Objective:   Physical Exam  General Mental Status- Alert. General Appearance- Not in acute distress.   Skin General: Color- Normal Color. Moisture- Normal Moisture.  Neck Carotid Arteries- Normal color. Moisture- Normal Moisture. No carotid bruits. No JVD.  Chest and Lung Exam Auscultation: Breath Sounds:-Normal.  Cardiovascular Auscultation:Rythm- Regular. Murmurs & Other Heart Sounds:Auscultation of the heart reveals- No Murmurs.  Abdomen Inspection:-Inspeection Normal. Palpation/Percussion:Note:No mass. Palpation and Percussion of the abdomen reveal- Non Tender, Non Distended + BS, no rebound or guarding.    Neurologic Cranial Nerve exam:- CN III-XII intact(No nystagmus), symmetric smile. Strength:- 5/5 equal and symmetric strength both upper and lower extremities.       Assessment & Plan:  For left testicle mass keep appointment with urologist on Monday.  For ED can continue viagra if needed. For premature ejaculation you may find sertraline may help.  For depression will see sertaline helps. May need to increase dose.   Do recommend to try counseling.  Follow up one month or as needed.  [place], PA-C   Time spent in addition to wellness  with patient today was 25 minutes which consisted of chart review, discussing diagnoses, work up, treatment, counseling on relationship and documentation.([id number] charge)

## 2019-11-26 NOTE — Addendum Note (Signed)
Addended by: Gwenevere Abbot on: 11/26/2019 09:02 AM   Modules accepted: Orders, Level of Service

## 2019-11-26 NOTE — Patient Instructions (Addendum)
For you wellness exam today I have ordered cbc, cmp, lipid panel  and hiv.  Vaccine given today.   Recommend exercise and healthy diet.  We will let you know lab results as they come in.  For left testicle mass keep appointment with urologist on Monday.  For ED can continue viagra if needed. For premature ejaculation you may find sertraline may help.  For depression will see sertaline helps. May need to increase dose.   Do recommend to try counseling.  Follow up one month or as needed.   Preventive Care 75-66 Years Old, Male Preventive care refers to lifestyle choices and visits with your health care provider that can promote health and wellness. This includes:  A yearly physical exam. This is also called an annual well check.  Regular dental and eye exams.  Immunizations.  Screening for certain conditions.  Healthy lifestyle choices, such as eating a healthy diet, getting regular exercise, not using drugs or products that contain nicotine and tobacco, and limiting alcohol use. What can I expect for my preventive care visit? Physical exam Your health care provider will check:  Height and weight. These may be used to calculate body mass index (BMI), which is a measurement that tells if you are at a healthy weight.  Heart rate and blood pressure.  Your skin for abnormal spots. Counseling Your health care provider may ask you questions about:  Alcohol, tobacco, and drug use.  Emotional well-being.  Home and relationship well-being.  Sexual activity.  Eating habits.  Work and work Statistician. What immunizations do I need?  Influenza (flu) vaccine  This is recommended every year. Tetanus, diphtheria, and pertussis (Tdap) vaccine  You may need a Td booster every 10 years. Varicella (chickenpox) vaccine  You may need this vaccine if you have not already been vaccinated. Zoster (shingles) vaccine  You may need this after age 81. Measles, mumps, and rubella  (MMR) vaccine  You may need at least one dose of MMR if you were born in 1957 or later. You may also need a second dose. Pneumococcal conjugate (PCV13) vaccine  You may need this if you have certain conditions and were not previously vaccinated. Pneumococcal polysaccharide (PPSV23) vaccine  You may need one or two doses if you smoke cigarettes or if you have certain conditions. Meningococcal conjugate (MenACWY) vaccine  You may need this if you have certain conditions. Hepatitis A vaccine  You may need this if you have certain conditions or if you travel or work in places where you may be exposed to hepatitis A. Hepatitis B vaccine  You may need this if you have certain conditions or if you travel or work in places where you may be exposed to hepatitis B. Haemophilus influenzae type b (Hib) vaccine  You may need this if you have certain risk factors. Human papillomavirus (HPV) vaccine  If recommended by your health care provider, you may need three doses over 6 months. You may receive vaccines as individual doses or as more than one vaccine together in one shot (combination vaccines). Talk with your health care provider about the risks and benefits of combination vaccines. What tests do I need? Blood tests  Lipid and cholesterol levels. These may be checked every 5 years, or more frequently if you are over 49 years old.  Hepatitis C test.  Hepatitis B test. Screening  Lung cancer screening. You may have this screening every year starting at age 62 if you have a 30-pack-year history of smoking  and currently smoke or have quit within the past 15 years.  Prostate cancer screening. Recommendations will vary depending on your family history and other risks.  Colorectal cancer screening. All adults should have this screening starting at age 46 and continuing until age 4. Your health care provider may recommend screening at age 50 if you are at increased risk. You will have tests  every 1-10 years, depending on your results and the type of screening test.  Diabetes screening. This is done by checking your blood sugar (glucose) after you have not eaten for a while (fasting). You may have this done every 1-3 years.  Sexually transmitted disease (STD) testing. Follow these instructions at home: Eating and drinking  Eat a diet that includes fresh fruits and vegetables, whole grains, lean protein, and low-fat dairy products.  Take vitamin and mineral supplements as recommended by your health care provider.  Do not drink alcohol if your health care provider tells you not to drink.  If you drink alcohol: ? Limit how much you have to 0-2 drinks a day. ? Be aware of how much alcohol is in your drink. In the [place] one drink equals one 12 oz bottle of beer (355 mL), one 5 oz glass of wine (148 mL), or one 1 oz glass of hard liquor (44 mL). Lifestyle  Take daily care of your teeth and gums.  Stay active. Exercise for at least 30 minutes on 5 or more days each week.  Do not use any products that contain nicotine or tobacco, such as cigarettes, e-cigarettes, and chewing tobacco. If you need help quitting, ask your health care provider.  If you are sexually active, practice safe sex. Use a condom or other form of protection to prevent STIs (sexually transmitted infections).  Talk with your health care provider about taking a low-dose aspirin every day starting at age 35. What's next?  Go to your health care provider once a year for a well check visit.  Ask your health care provider how often you should have your eyes and teeth checked.  Stay up to date on all vaccines. This information is not intended to replace advice given to you by your health care provider. Make sure you discuss any questions you have with your health care provider. Document Revised: 04/18/2018 Document Reviewed: 04/18/2018 Elsevier Patient Education  2020 [employer].

## 2019-11-27 LAB — TESTOSTERONE TOTAL,FREE,BIO, MALES
Albumin: 4.8 g/dL (ref 3.6–5.1)
Sex Hormone Binding: 30 nmol/L (ref 10–50)
Testosterone, Bioavailable: 120.8 ng/dL (ref >=110.0)
Testosterone, Free: 55.2 pg/mL (ref 46.0–224.0)
Testosterone: 399 ng/dL (ref 250–827)

## 2019-11-27 LAB — HIV ANTIBODY (ROUTINE TESTING W REFLEX): HIV 1&2 Ab, 4th Generation: NONREACTIVE

## 2019-12-01 DIAGNOSIS — D2930 Benign neoplasm of unspecified epididymis: Secondary | ICD-10-CM | POA: Diagnosis not present

## 2019-12-30 ENCOUNTER — Other Ambulatory Visit: Payer: Self-pay

## 2019-12-30 ENCOUNTER — Ambulatory Visit (INDEPENDENT_AMBULATORY_CARE_PROVIDER_SITE_OTHER): Payer: BC Managed Care – PPO | Admitting: Medical

## 2019-12-30 VITALS — BP 120/70 | HR 64 | Resp 18 | Ht 67.0 in | Wt 187.0 lb

## 2019-12-30 DIAGNOSIS — N5089 Other specified disorders of the male genital organs: Secondary | ICD-10-CM

## 2019-12-30 DIAGNOSIS — F329 Major depressive disorder, single episode, unspecified: Secondary | ICD-10-CM | POA: Diagnosis not present

## 2019-12-30 DIAGNOSIS — F32A Depression, unspecified: Secondary | ICD-10-CM

## 2019-12-30 NOTE — Patient Instructions (Addendum)
Mood overall better with sertraline low dose. Going forward if you feel can be better/need adjustment can increase dose to 50 mg daily. Update me if you decide to do this. Recommend you try therapy as well as this would likely be helpful. If you need referral let me know.  For epididymal mass continue to follow up with urologist.  Follow up 3 months for cpe/wellness or as needed

## 2019-12-30 NOTE — Progress Notes (Signed)
Subjective:    Patient ID: Casey Curtis, male    DOB: 14-Jan-1976, 44 y.o.   MRN: [id number]  HPI  Pt in for follow up.  Pt did see urologist for left testicle pain. [place] on day I saw him. Per urologist pt states findings on [place] likely benign. He has follow up with them in October.  IMPRESSION: Normal appearing testes and RIGHT epididymis.  10 x 8 x 9 mm heterogeneous mass with an area of mild hyperechogenicity within body of LEFT epididymis corresponding to palpable finding.  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  Recommend monthly self examination with follow-up assessment sooner if there is interval change on exam.  For depression pt has been using low dose sertraline. He states he can tell difference. He has been feeling better. He states relationship with his wife is better.    Review of Systems  Constitutional: Negative for chills, fatigue and fever.  Respiratory: Negative for cough, chest tightness, shortness of breath and wheezing.   Cardiovascular: Negative for chest pain and palpitations.  Gastrointestinal: Negative for abdominal pain and blood in stool.  Musculoskeletal: Negative for back pain and neck pain.  Skin: Negative for rash.  Neurological: Negative for dizziness, speech difficulty, light-headedness, numbness and headaches.  Hematological: Negative for adenopathy. Does not bruise/bleed easily.  Psychiatric/Behavioral: Negative for behavioral problems, decreased concentration, dysphoric mood and hallucinations. The patient is not nervous/anxious.        Mood better with sertraline.    Past Medical History:  Diagnosis Date  . Depression      Social History   Socioeconomic History  . Marital status: Married    Spouse name: Not on file  . Number of children: Not on file  . Years of education: Not on file  .  Highest education level: Not on file  Occupational History  . Not on file  Tobacco Use  . Smoking status: Never Smoker  . Smokeless tobacco: Former Engineer, water and Sexual Activity  . Alcohol use: Yes  . Drug use: No  . Sexual activity: Not on file  Other Topics Concern  . Not on file  Social History Narrative  . Not on file   Social Determinants of Health   Financial Resource Strain:   . Difficulty of Paying Living Expenses: Not on file  Food Insecurity:   . Worried About Programme researcher, broadcasting/film/video in the Last Year: Not on file  . Ran Out of Food in the Last Year: Not on file  Transportation Needs:   . Lack of Transportation (Medical): Not on file  . Lack of Transportation (Non-Medical): Not on file  Physical Activity:   . Days of Exercise per Week: Not on file  . Minutes of Exercise per Session: Not on file  Stress:   . Feeling of Stress : Not on file  Social Connections:   . Frequency of Communication with Friends and Family: Not on file  . Frequency of Social Gatherings with Friends and Family: Not on file  . Attends Religious Services: Not on file  . Active Member of Clubs or Organizations: Not on file  . Attends Banker Meetings: Not on file  . Marital Status: Not on file  Intimate Partner Violence:   . Fear of Current or Ex-Partner: Not on file  . Emotionally Abused: Not on file  . Physically Abused: Not  on file  . Sexually Abused: Not on file    Past Surgical History:  Procedure Laterality Date  . KNEE SURGERY      Family History  Problem Relation Age of Onset  . Memory loss Father   . Arthritis Other   . Hyperlipidemia Other   . Stroke Other   . Hypertension Other   . Mental illness Other   . Diabetes Other     Allergies  Allergen Reactions  . Augmentin [Amoxicillin-Pot Clavulanate]     Current Outpatient Medications on File Prior to Visit  Medication Sig Dispense Refill  . ciprofloxacin (CIPRO) 500 MG tablet Take 1 tablet (500 mg  total) by mouth 2 (two) times daily. (Patient not taking: Reported on 11/26/2019) 20 tablet 0  . Ginkgo Biloba 40 MG TABS Take by mouth.     . sertraline (ZOLOFT) 25 MG tablet Take 1 tablet (25 mg total) by mouth daily. 30 tablet 1  . sildenafil (VIAGRA) 25 MG tablet Take 1 tablet (25 mg total) by mouth daily as needed for erectile dysfunction. 10 tablet 0   No current facility-administered medications on file prior to visit.    BP 120/70   Pulse 64   Resp 18   Ht 5\' 7"  (1.702 m)   Wt 187 lb (84.8 kg)   SpO2 96%   BMI 29.29 kg/m        Objective:   Physical Exam  General Mental Status- Alert. General Appearance- Not in acute distress.   Skin General: Color- Normal Color. Moisture- Normal Moisture.  Neck Carotid Arteries- Normal color. Moisture- Normal Moisture. No carotid bruits. No JVD.  Chest and Lung Exam Auscultation: Breath Sounds:-Normal.  Cardiovascular Auscultation:Rythm- Regular. Murmurs & Other Heart Sounds:Auscultation of the heart reveals- No Murmurs.  Abdomen Inspection:-Inspeection Normal. Palpation/Percussion:Note:No mass. Palpation and Percussion of the abdomen reveal- Non Tender, Non Distended + BS, no rebound or guarding.   Neurologic Cranial Nerve exam:- CN III-XII intact(No nystagmus), symmetric smile. Strength:- 5/5 equal and symmetric strength both upper and lower extremities.      Assessment & Plan:  Mood overall better with sertraline low dose. Going forward if you feel can be better/need adjustment can increase dose to 50 mg daily. Update me if you decide to do this. Recommend you try therapy as well as this would likely be helpful. If you need referral let me know.  For epididymal mass continue to follow up with urologist.  Follow up 3 months for cpe/wellness or as needed  , PA-C   Time spent with patient today was 13  minutes which consisted of chart revdiew, discussing diagnosis, work up treatment and  documentation.

## 2020-01-16 ENCOUNTER — Encounter: Payer: Self-pay | Admitting: Medical

## 2020-01-19 MED ORDER — SERTRALINE HCL 25 MG PO TABS: 25.0000 mg | ORAL_TABLET | Freq: Every day | ORAL | 2 refills | Status: DC

## 2020-02-10 ENCOUNTER — Telehealth: Payer: Self-pay | Admitting: Medical

## 2020-02-10 ENCOUNTER — Encounter: Payer: Self-pay | Admitting: Medical

## 2020-02-10 MED ORDER — SERTRALINE HCL 50 MG PO TABS
50.0000 mg | ORAL_TABLET | Freq: Every day | ORAL | 3 refills | Status: DC
Start: 2020-02-10 — End: 2020-10-25

## 2020-02-10 NOTE — Telephone Encounter (Signed)
Higher dose sertraline 50 mg sent to pharmacy. My chart message indicates he is doing better with higher dose. Will ask him to follow up in one month.

## 2020-03-19 ENCOUNTER — Encounter: Payer: BC Managed Care – PPO | Admitting: Medical

## 2020-05-14 ENCOUNTER — Ambulatory Visit: Payer: BC Managed Care – PPO | Admitting: Medical

## 2020-07-16 ENCOUNTER — Ambulatory Visit: Payer: Self-pay | Admitting: Medical

## 2020-10-25 ENCOUNTER — Encounter: Payer: Self-pay | Admitting: Medical

## 2020-10-25 ENCOUNTER — Other Ambulatory Visit: Payer: Self-pay

## 2020-10-25 ENCOUNTER — Ambulatory Visit (INDEPENDENT_AMBULATORY_CARE_PROVIDER_SITE_OTHER): Payer: No Typology Code available for payment source | Admitting: Medical

## 2020-10-25 VITALS — BP 136/83 | HR 62 | Temp 98.7°F | Resp 18 | Ht 67.0 in | Wt 187.0 lb

## 2020-10-25 DIAGNOSIS — Z0000 Encounter for general adult medical examination without abnormal findings: Secondary | ICD-10-CM

## 2020-10-25 LAB — LIPID PANEL
Cholesterol: 184 mg/dL (ref 0–200)
HDL: 78.4 mg/dL (ref >39.00)
LDL Cholesterol: 87 mg/dL (ref 0–99)
NonHDL: 105.52
Total CHOL/HDL Ratio: 2
Triglycerides: 91 mg/dL (ref 0.0–149.0)
VLDL: 18.2 mg/dL (ref 0.0–40.0)

## 2020-10-25 LAB — COMPREHENSIVE METABOLIC PANEL
ALT: 34 U/L (ref 0–53)
AST: 23 U/L (ref 0–37)
Albumin: 4.3 g/dL (ref 3.5–5.2)
Alkaline Phosphatase: 48 U/L (ref 39–117)
BUN: 17 mg/dL (ref 6–23)
CO2: 28 mEq/L (ref 19–32)
Calcium: 9 mg/dL (ref 8.4–10.5)
Chloride: 105 mEq/L (ref 96–112)
Creatinine, Ser: 1.16 mg/dL (ref 0.40–1.50)
GFR: 77.43 mL/min (ref >60.00)
Glucose, Bld: 100 mg/dL — ABNORMAL HIGH (ref 70–99)
Potassium: 4.5 mEq/L (ref 3.5–5.1)
Sodium: 140 mEq/L (ref 135–145)
Total Bilirubin: 0.5 mg/dL (ref 0.2–1.2)
Total Protein: 6.4 g/dL (ref 6.0–8.3)

## 2020-10-25 LAB — CBC WITH DIFFERENTIAL/PLATELET
Basophils Absolute: 0 10*3/uL (ref 0.0–0.1)
Basophils Relative: 0.3 % (ref 0.0–3.0)
Eosinophils Absolute: 0.1 10*3/uL (ref 0.0–0.7)
Eosinophils Relative: 1.6 % (ref 0.0–5.0)
HCT: 41.7 % (ref 39.0–52.0)
Hemoglobin: 14.1 g/dL (ref 13.0–17.0)
Lymphocytes Relative: 31.3 % (ref 12.0–46.0)
Lymphs Abs: 1.8 10*3/uL (ref 0.7–4.0)
MCHC: 33.7 g/dL (ref 30.0–36.0)
MCV: 94.8 fl (ref 78.0–100.0)
Monocytes Absolute: 0.7 10*3/uL (ref 0.1–1.0)
Monocytes Relative: 12.7 % — ABNORMAL HIGH (ref 3.0–12.0)
Neutro Abs: 3.2 10*3/uL (ref 1.4–7.7)
Neutrophils Relative %: 54.1 % (ref 43.0–77.0)
Platelets: 211 10*3/uL (ref 150.0–400.0)
RBC: 4.4 Mil/uL (ref 4.22–5.81)
RDW: 13.2 % (ref 11.5–15.5)
WBC: 5.9 10*3/uL (ref 4.0–10.5)

## 2020-10-25 NOTE — Progress Notes (Signed)
Subjective:    Patient ID: Casey Curtis, male    DOB: August 05, 1975, 45 y.o.   MRN: [id number]  HPI Needs wellness exam. 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 Pt has 2 children. Married. Pt is active doing things around house(working out some cardio and some weight lifting). Pt states eats semi healthy. Non smoker. He does drinks etoh on weekend. 2-3 weeks.Pt enjoys watching football.   Review of Systems  Constitutional:  Negative for chills, fatigue and fever.  HENT:  Negative for dental problem, ear pain and facial swelling.   Respiratory:  Negative for cough, choking and wheezing.   Cardiovascular:  Negative for chest pain and palpitations.  Gastrointestinal:  Negative for abdominal pain, diarrhea, nausea and vomiting.  Genitourinary:  Negative for dysuria, frequency, hematuria and urgency.  Musculoskeletal:  Negative for back pain and myalgias.  Skin:  Negative for rash.  Neurological:  Negative for dizziness, seizures, weakness and light-headedness.  Psychiatric/Behavioral:  Negative for behavioral problems, decreased concentration, dysphoric mood and suicidal ideas. The patient is not nervous/anxious.     Past Medical History:  Diagnosis Date   Depression      Social History   Socioeconomic History   Marital status: Married    Spouse name: Not on file   Number of children: Not on file   Years of education: Not on file   Highest education level: Not on file  Occupational History   Not on file  Tobacco Use   Smoking status: Never   Smokeless tobacco: Former  Substance and Sexual Activity   Alcohol use: Yes   Drug use: No   Sexual activity: Not on file  Other Topics Concern   Not on file  Social History Narrative   Not on file   Social Determinants of Health   Financial Resource Strain: Not on file  Food Insecurity: Not on file  Transportation Needs: Not on file  Physical Activity: Not on file  Stress: Not  on file  Social Connections: Not on file  Intimate Partner Violence: Not on file    Past Surgical History:  Procedure Laterality Date   KNEE SURGERY      Family History  Problem Relation Age of Onset   Memory loss Father    Arthritis Other    Hyperlipidemia Other    Stroke Other    Hypertension Other    Mental illness Other    Diabetes Other     Allergies  Allergen Reactions   Augmentin [Amoxicillin-Pot Clavulanate]     Current Outpatient Medications on File Prior to Visit  Medication Sig Dispense Refill   Ginkgo Biloba 40 MG TABS Take by mouth.      No current facility-administered medications on file prior to visit.    BP 136/83   Pulse 62   Temp 98.7 F (37.1 C)   Resp 18   Ht 5\' 7"  (1.702 m)   Wt 187 lb (84.8 kg)   SpO2 100%   BMI 29.29 kg/m        Objective:   Physical Exam  General Mental Status- Alert. General Appearance- Not in acute distress.   Skin General: Color- Normal Color. Moisture- Normal Moisture.  Neck Carotid Arteries- Normal color. Moisture- Normal Moisture. No carotid bruits. No JVD.  Chest and Lung Exam Auscultation: Breath Sounds:-Normal.  Cardiovascular Auscultation:Rythm- Regular. Murmurs & Other Heart Sounds:Auscultation of the heart reveals- No Murmurs.  Abdomen Inspection:-Inspeection Normal. Palpation/Percussion:Note:No mass. Palpation and  Percussion of the abdomen reveal- Non Tender, Non Distended + BS, no rebound or guarding.   Neurologic Cranial Nerve exam:- CN III-XII intact(No nystagmus), symmetric smile. Strength:- 5/5 equal and symmetric strength both upper and lower extremities.       Assessment & Plan:   For you wellness exam today I have ordered cbc, cmp and lipid panel.  Vaccine appear up to date.  Recommend exercise and healthy diet.  We will let you know lab results as they come in.  Follow up date appointment will be determined after lab review.     Esperanza Richters, PA-C

## 2020-10-25 NOTE — Patient Instructions (Signed)
For you wellness exam today I have ordered cbc, cmp and lipid panel.  Vaccine appear up to date.  Recommend exercise and healthy diet.  We will let you know lab results as they come in.  Follow up date appointment will be determined after lab review.      Preventive Care 40-43 Years Old, Male Preventive care refers to lifestyle choices and visits with your health care provider that can promote health and wellness. This includes: A yearly physical exam. This is also called an annual wellness visit. Regular dental and eye exams. Immunizations. Screening for certain conditions. Healthy lifestyle choices, such as: Eating a healthy diet. Getting regular exercise. Not using drugs or products that contain nicotine and tobacco. Limiting alcohol use. What can I expect for my preventive care visit? Physical exam Your health care provider will check your: Height and weight. These may be used to calculate your BMI (body mass index). BMI is a measurement that tells if you are at a healthy weight. Heart rate and blood pressure. Body temperature. Skin for abnormal spots. Counseling Your health care provider may ask you questions about your: Past medical problems. Family's medical history. Alcohol, tobacco, and drug use. Emotional well-being. Home life and relationship well-being. Sexual activity. Diet, exercise, and sleep habits. Work and work environment. Access to firearms. What immunizations do I need?  Vaccines are usually given at various ages, according to a schedule. Your health care provider will recommend vaccines for you based on your age, medicalhistory, and lifestyle or other factors, such as travel or where you work. What tests do I need? Blood tests Lipid and cholesterol levels. These may be checked every 5 years, or more often if you are over 50 years old. Hepatitis C test. Hepatitis B test. Screening Lung cancer screening. You may have this screening every year  starting at age 55 if you have a 30-pack-year history of smoking and currently smoke or have quit within the past 15 years. Prostate cancer screening. Recommendations will vary depending on your family history and other risks. Genital exam to check for testicular cancer or hernias. Colorectal cancer screening. All adults should have this screening starting at age 50 and continuing until age 75. Your health care provider may recommend screening at age 45 if you are at increased risk. You will have tests every 1-10 years, depending on your results and the type of screening test. Diabetes screening. This is done by checking your blood sugar (glucose) after you have not eaten for a while (fasting). You may have this done every 1-3 years. STD (sexually transmitted disease) testing, if you are at risk. Follow these instructions at home: Eating and drinking  Eat a diet that includes fresh fruits and vegetables, whole grains, lean protein, and low-fat dairy products. Take vitamin and mineral supplements as recommended by your health care provider. Do not drink alcohol if your health care provider tells you not to drink. If you drink alcohol: Limit how much you have to 0-2 drinks a day. Be aware of how much alcohol is in your drink. In the [place] one drink equals one 12 oz bottle of beer (355 mL), one 5 oz glass of wine (148 mL), or one 1 oz glass of hard liquor (44 mL).  Lifestyle Take daily care of your teeth and gums. Brush your teeth every morning and night with fluoride toothpaste. Floss one time each day. Stay active. Exercise for at least 30 minutes 5 or more days each week. Do not use   any products that contain nicotine or tobacco, such as cigarettes, e-cigarettes, and chewing tobacco. If you need help quitting, ask your health care provider. Do not use drugs. If you are sexually active, practice safe sex. Use a condom or other form of protection to prevent STIs (sexually transmitted  infections). If told by your health care provider, take low-dose aspirin daily starting at age 50. Find healthy ways to cope with stress, such as: Meditation, yoga, or listening to music. Journaling. Talking to a trusted person. Spending time with friends and family. Safety Always wear your seat belt while driving or riding in a vehicle. Do not drive: If you have been drinking alcohol. Do not ride with someone who has been drinking. When you are tired or distracted. While texting. Wear a helmet and other protective equipment during sports activities. If you have firearms in your house, make sure you follow all gun safety procedures. What's next? Go to your health care provider once a year for an annual wellness visit. Ask your health care provider how often you should have your eyes and teeth checked. Stay up to date on all vaccines. This information is not intended to replace advice given to you by your health care provider. Make sure you discuss any questions you have with your healthcare provider. Document Revised: 01/21/2019 Document Reviewed: 04/18/2018 Elsevier Patient Education  2022 [employer]  

## 2021-03-04 IMAGING — US US SCROTUM W/ DOPPLER COMPLETE
1 series · 13 of 25 positions shown · non-contrast
Comparison: None

CLINICAL DATA: LEFT testicular pain for 2 days, palpable nodule

EXAM:
SCROTAL ULTRASOUND
DOPPLER ULTRASOUND OF THE TESTICLES
TECHNIQUE: Complete ultrasound examination of the testicles, epididymis, and
other scrotal structures was performed. Color and spectral Doppler
ultrasound were also utilized to evaluate blood flow to the
testicles.

[Series 1: us scrotum w/ doppler complete · 13 of 51 slices shown]
[im 1/51]
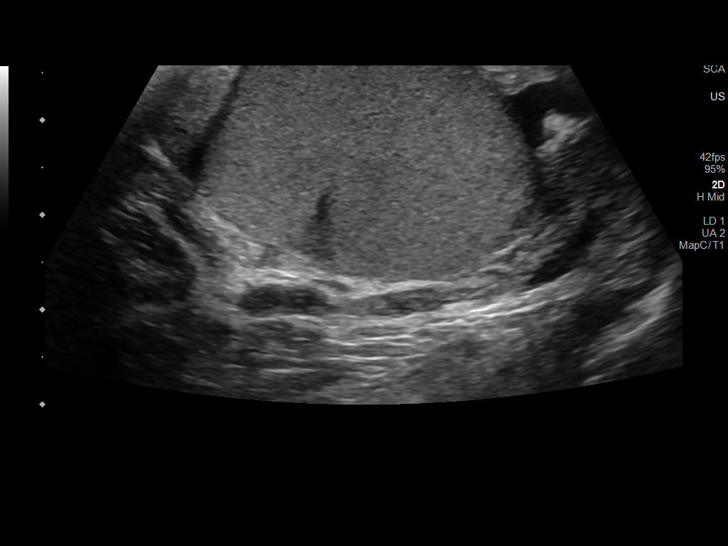
[im 5/51]
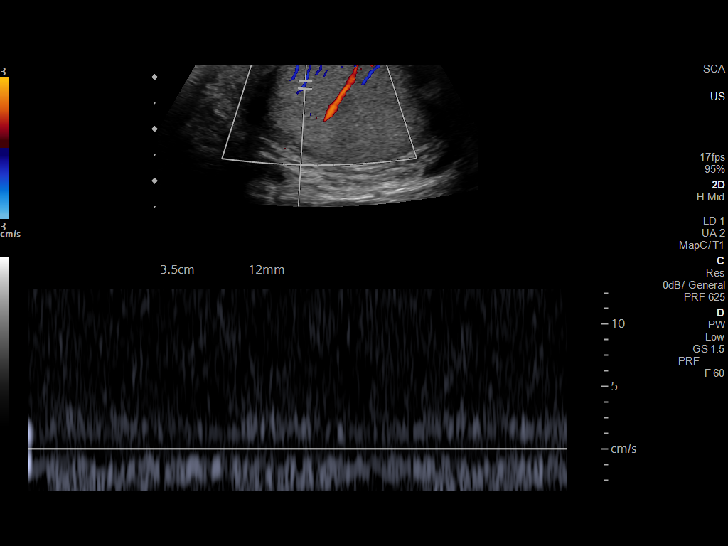
[im 9/51]
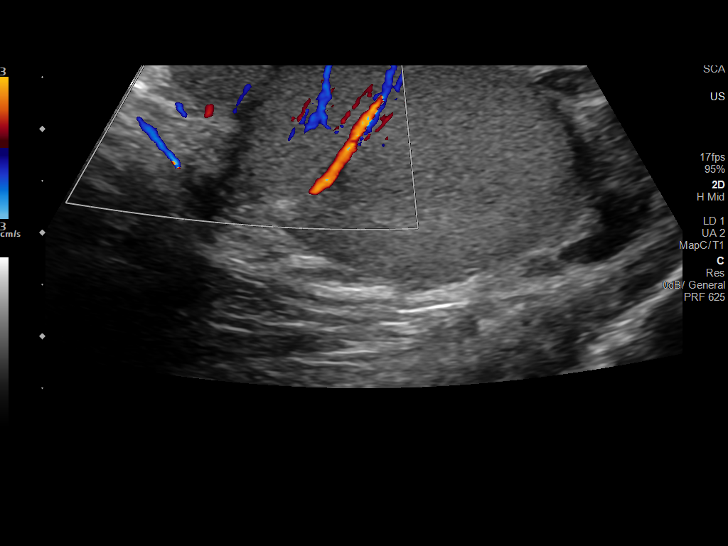
[im 13/51]
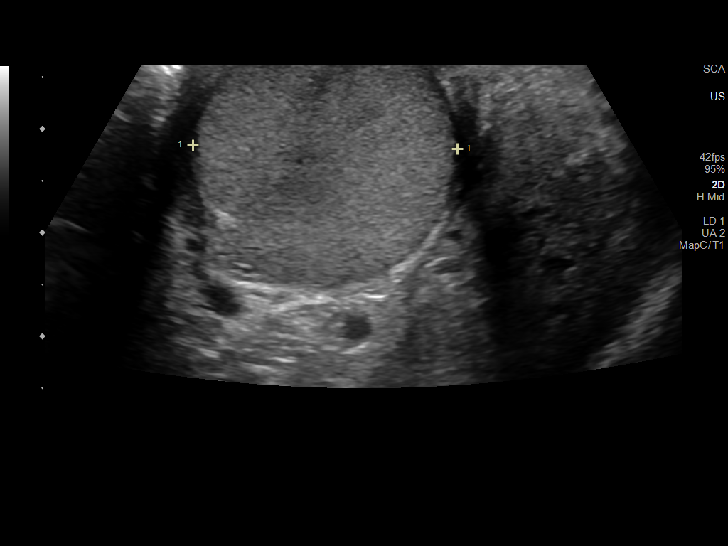
[im 17/51]
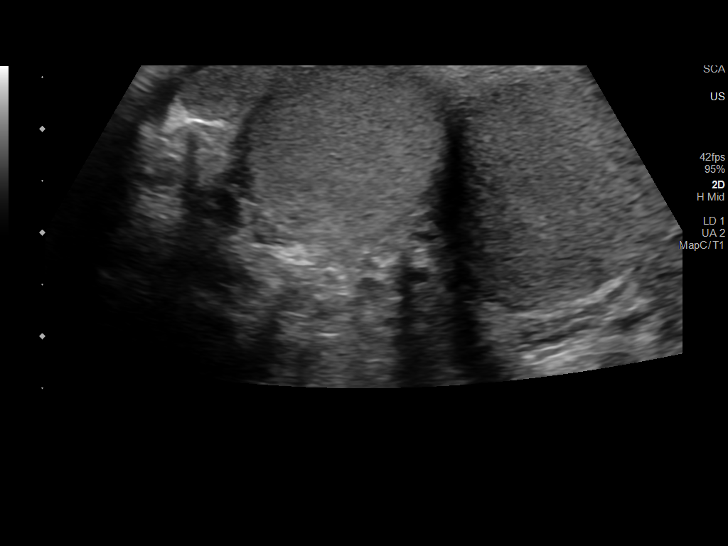
[im 21/51]
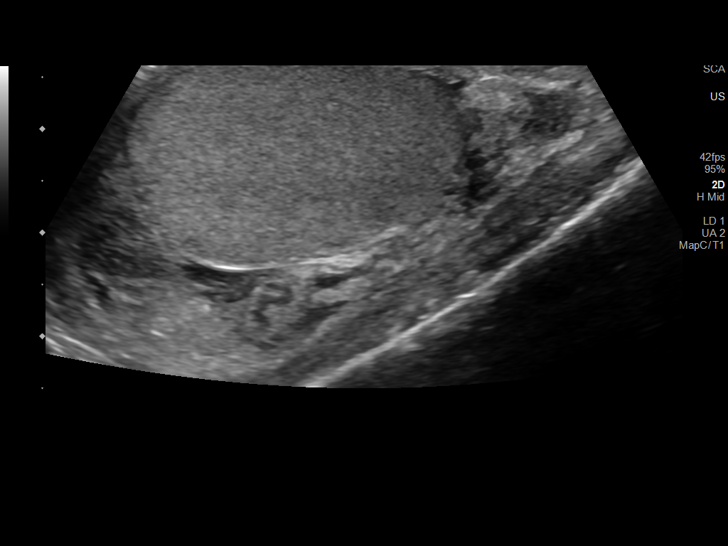
[im 26/51]
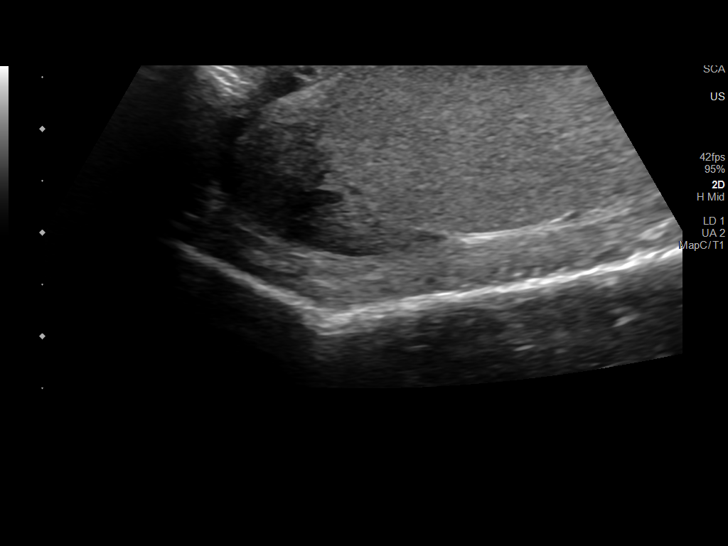
[im 30/51]
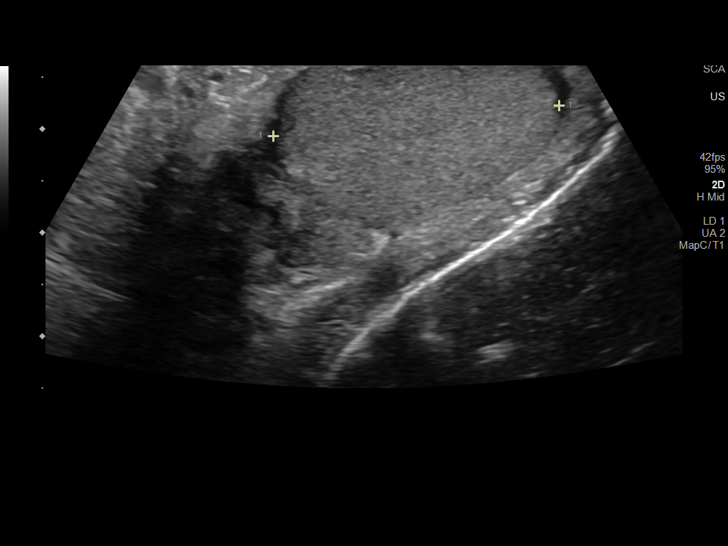
[im 34/51]
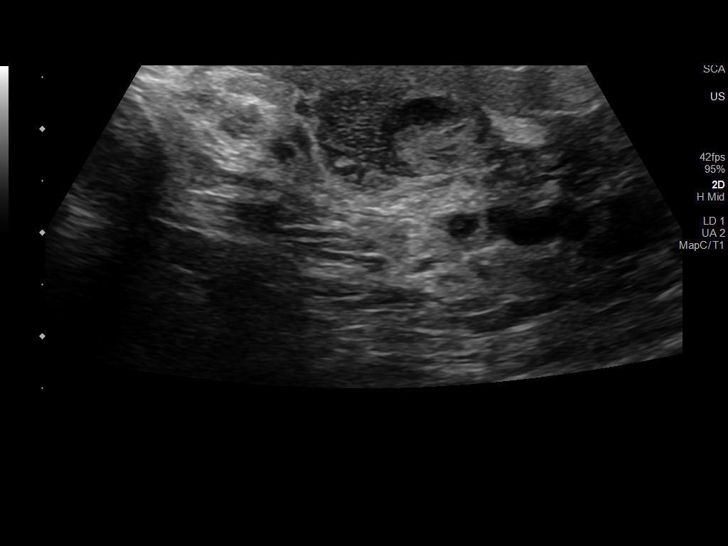
[im 38/51]
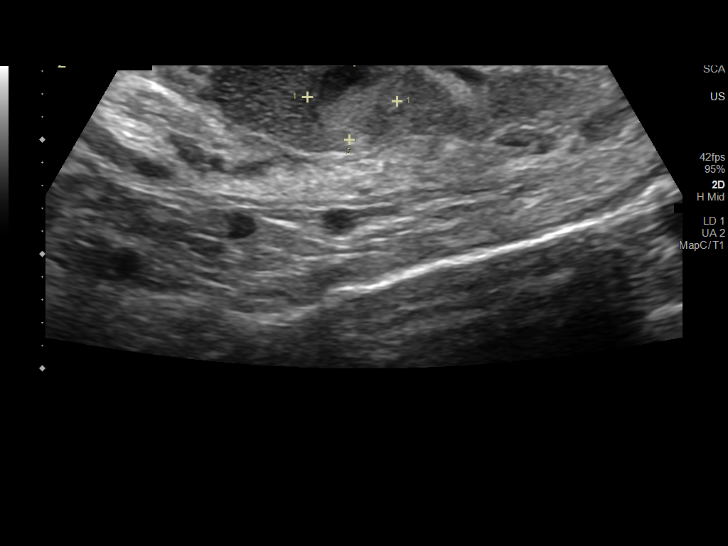
[im 42/51]
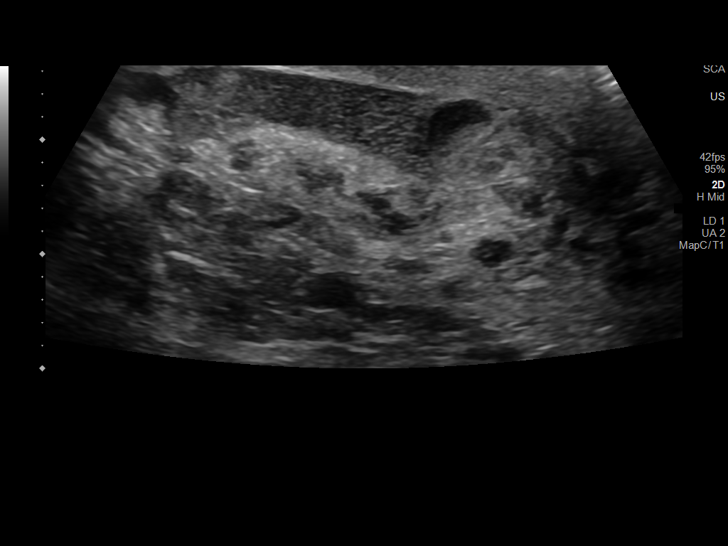
[im 46/51]
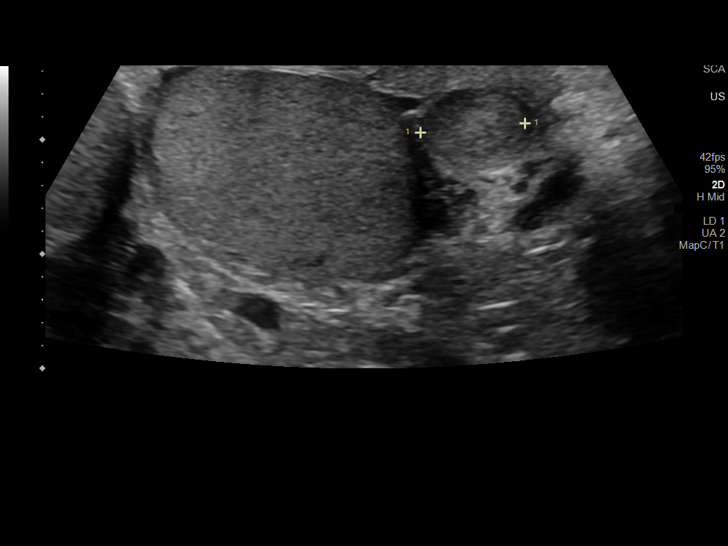
[im 51/51]
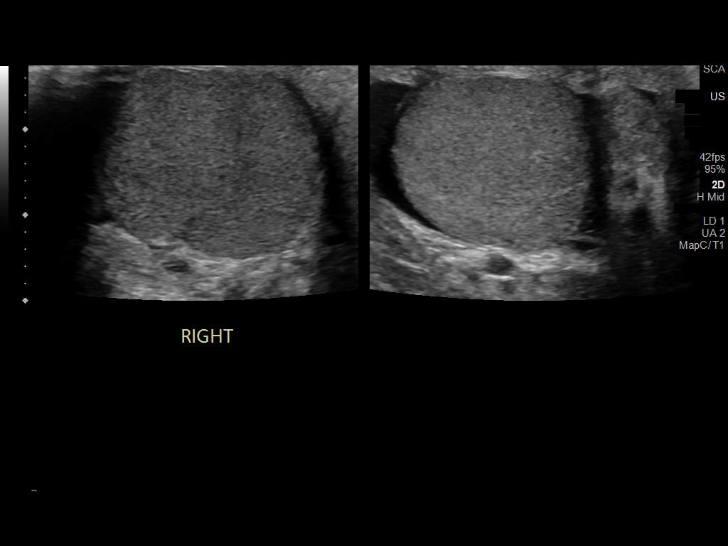

[13 of 25 positions shown; findings below may reference images not displayed]

FINDINGS: Right testicle

Measurements: 3.6 x 2.4 x 2.6 cm. Minimal parenchymal inhomogeneity.
No testicular mass or calcification. Internal blood flow present on
color Doppler imaging.

Left testicle

Measurements: 4.0 x 1.6 x 2.8 cm. Normal echogenicity without mass
or calcification. Internal blood flow present on color Doppler
imaging.

Right epididymis:  Normal in size and appearance.

Left epididymis: 10 x 8 x 9 mm heterogeneous mass with large area of
slight hyperechogenicity seen within the body of the LEFT
epididymis, corresponding to palpable concern. Mass demonstrates
internal blood flow on color Doppler imaging. No additional
epididymal masses or cysts.

Hydrocele:  Trace BILATERAL hydroceles

Varicocele:  None visualized.

Pulsed Doppler interrogation of both testes demonstrates normal low
resistance arterial and venous waveforms bilaterally.
IMPRESSION: Normal appearing testes and RIGHT epididymis.

10 x 8 x 9 mm heterogeneous mass with an area of mild
hyperechogenicity within body of LEFT epididymis corresponding to
palpable finding.

Great majority of solid epididymal masses are benign, with most
common tumor being an adenomatoid tumor; follow-up ultrasound
imaging recommended in 6 months to ensure stability.

Recommend monthly self examination with follow-up assessment sooner
if there is interval change on exam.

## 2021-04-15 ENCOUNTER — Encounter: Payer: Self-pay | Admitting: Family

## 2021-04-15 ENCOUNTER — Ambulatory Visit: Payer: No Typology Code available for payment source | Admitting: Family

## 2021-04-15 VITALS — BP 100/70 | HR 72 | Temp 98.1°F | Ht 67.0 in | Wt 190.8 lb

## 2021-04-15 DIAGNOSIS — R238 Other skin changes: Secondary | ICD-10-CM

## 2021-04-15 DIAGNOSIS — R109 Unspecified abdominal pain: Secondary | ICD-10-CM | POA: Diagnosis not present

## 2021-04-15 NOTE — Progress Notes (Signed)
Casey Curtis is a 45 y.o. male with the following history as recorded in Epic[employer]:  Patient Active Problem List   Diagnosis Date Noted   Urinary frequency 02/05/2014   ED (erectile dysfunction) 10/25/2011   Routine general medical examination at a health care facility 10/25/2011   MIGRAINE HEADACHE 11/16/2008   ALLERGIC RHINITIS 11/16/2008   CHEST WALL PAIN, HX OF 11/16/2008    No current outpatient medications on file.   No current facility-administered medications for this visit.    Allergies: Augmentin [amoxicillin-pot clavulanate]  Past Medical History:  Diagnosis Date   Depression     Past Surgical History:  Procedure Laterality Date   KNEE SURGERY      Family History  Problem Relation Age of Onset   Memory loss Father    Arthritis Other    Hyperlipidemia Other    Stroke Other    Hypertension Other    Mental illness Other    Diabetes Other     Social History   Tobacco Use   Smoking status: Never   Smokeless tobacco: Former  Substance Use Topics   Alcohol use: Yes    Subjective:  LLQ pain x 2-3 weeks; originally thought that could be related to increased exercise but symptoms have persisted even with resting; no urinary or bowel changes; no history of kidney stones; feels like pain will start in abdomen and then move into back; no numbness or tingling or radiating pain into lower extremities;   Also requesting referral to dermatology- concerned about facial skin tags that are changing and skin changes on lower extremities;      Objective:  Vitals:   04/15/21 1342  BP: 100/70  Pulse: 72  Temp: 98.1 F (36.7 C)  TempSrc: Oral  SpO2: 98%  Weight: 190 lb 12.8 oz (86.5 kg)  Height: _0  (1.702 m)    General: Well developed, well nourished, in no acute distress  Skin : Warm and dry.  Head: Normocephalic and atraumatic  Eyes: Sclera and conjunctiva clear; pupils round and reactive to light; extraocular movements intact  Ears: External normal; canals  clear; tympanic membranes normal  Oropharynx: Pink, supple. No suspicious lesions  Neck: Supple without thyromegaly, adenopathy  Lungs: Respirations unlabored;  Abdomen: Soft; nontender; nondistended; normoactive bowel sounds; no masses or hepatosplenomegaly  Musculoskeletal: No deformities; no active joint inflammation  Extremities: No edema, cyanosis, clubbing  Vessels: Symmetric bilaterally  Neurologic: Alert and oriented; speech intact; face symmetrical; moves all extremities well; CNII-XII intact without focal deficit   Assessment:  1. Abdominal pain, unspecified abdominal location   2. Other skin changes     Plan:  Update abdominal/ pelvic CT; check CBC, CMP, U/A today; ? Hernia vs diverticulitis; Refer to dermatology;  This visit occurred during the SARS-CoV-2 public health emergency.  Safety protocols were in place, including screening questions prior to the visit, additional usage of staff PPE, and extensive cleaning of exam room while observing appropriate contact time as indicated for disinfecting solutions.    No follow-ups on file.  Orders Placed This Encounter  Procedures   CT Abdomen Pelvis W Contrast    Standing Status:   Future    Standing Expiration Date:   04/15/2022    Order Specific Question:   If indicated for the ordered procedure, I authorize the administration of contrast media per Radiology protocol    Answer:   Yes    Order Specific Question:   Preferred imaging location?    Answer:   [facility]    Order Specific Question:   Is Oral Contrast requested for this exam?    Answer:   Yes, Per Radiology protocol   CBC with Differential/Platelet   Comp Met (CMET)   Urinalysis   Ambulatory referral to Dermatology    Referral Priority:   Routine    Referral Type:   Consultation    Referral Reason:   Specialty Services Required    Requested Specialty:   Dermatology    Number of Visits Requested:   1    Requested Prescriptions    No prescriptions  requested or ordered in this encounter

## 2021-04-16 LAB — CBC WITH DIFFERENTIAL/PLATELET
Absolute Monocytes: 643 {cells}/uL (ref 200–950)
Basophils Absolute: 41 {cells}/uL (ref 0–200)
Basophils Relative: 0.7 %
Eosinophils Absolute: 53 {cells}/uL (ref 15–500)
Eosinophils Relative: 0.9 %
HCT: 43.8 % (ref 38.5–50.0)
Hemoglobin: 15.2 g/dL (ref 13.2–17.1)
Lymphs Abs: 2065 {cells}/uL (ref 850–3900)
MCH: 32.4 pg (ref 27.0–33.0)
MCHC: 34.7 g/dL (ref 32.0–36.0)
MCV: 93.4 fL (ref 80.0–100.0)
MPV: 8.9 fL (ref 7.5–12.5)
Monocytes Relative: 10.9 %
Neutro Abs: 3098 {cells}/uL (ref 1500–7800)
Neutrophils Relative %: 52.5 %
Platelets: 232 10*3/uL (ref 140–400)
RBC: 4.69 Million/uL (ref 4.20–5.80)
RDW: 12.7 % (ref 11.0–15.0)
Total Lymphocyte: 35 %
WBC: 5.9 10*3/uL (ref 3.8–10.8)

## 2021-04-16 LAB — COMPREHENSIVE METABOLIC PANEL
AG Ratio: 1.7 (calc) (ref 1.0–2.5)
ALT: 39 U/L (ref 9–46)
AST: 23 U/L (ref 10–40)
Albumin: 4.4 g/dL (ref 3.6–5.1)
Alkaline phosphatase (APISO): 55 U/L (ref 36–130)
BUN: 15 mg/dL (ref 7–25)
CO2: 27 mmol/L (ref 20–32)
Calcium: 9.2 mg/dL (ref 8.6–10.3)
Chloride: 104 mmol/L (ref 98–110)
Creat: 1.11 mg/dL (ref 0.60–1.29)
Globulin: 2.6 g/dL (calc) (ref 1.9–3.7)
Glucose, Bld: 94 mg/dL (ref 65–99)
Potassium: 4 mmol/L (ref 3.5–5.3)
Sodium: 140 mmol/L (ref 135–146)
Total Bilirubin: 0.6 mg/dL (ref 0.2–1.2)
Total Protein: 7 g/dL (ref 6.1–8.1)

## 2021-04-16 LAB — URINALYSIS
Bilirubin Urine: NEGATIVE
Glucose, UA: NEGATIVE
Hgb urine dipstick: NEGATIVE
Ketones, ur: NEGATIVE
Leukocytes,Ua: NEGATIVE
Nitrite: NEGATIVE
Protein, ur: NEGATIVE
Specific Gravity, Urine: 1.025 (ref 1.001–1.035)
pH: 5.5 (ref 5.0–8.0)

## 2021-04-20 ENCOUNTER — Telehealth: Payer: Self-pay | Admitting: Medical

## 2021-04-20 NOTE — Telephone Encounter (Signed)
[employer] called and stated their system showed ct scan was going to [facility] and just wanted to confirm it was supposed to go to [facility], Berkley Harvey #[id number].

## 2021-04-25 ENCOUNTER — Ambulatory Visit (HOSPITAL_BASED_OUTPATIENT_CLINIC_OR_DEPARTMENT_OTHER): Payer: No Typology Code available for payment source

## 2021-04-28 ENCOUNTER — Ambulatory Visit (HOSPITAL_BASED_OUTPATIENT_CLINIC_OR_DEPARTMENT_OTHER): Payer: No Typology Code available for payment source

## 2021-05-24 ENCOUNTER — Ambulatory Visit
Admission: RE | Admit: 2021-05-24 | Discharge: 2021-05-24 | Disposition: A | Discharge status: Home / Self Care | Payer: No Typology Code available for payment source | Source: Ambulatory Visit | Attending: Family | Admitting: Family

## 2021-05-24 ENCOUNTER — Other Ambulatory Visit: Payer: Self-pay

## 2021-05-24 DIAGNOSIS — R109 Unspecified abdominal pain: Secondary | ICD-10-CM

## 2021-05-24 MED ORDER — IOPAMIDOL (ISOVUE-300) INJECTION 61%
100.0000 mL | Freq: Once | INTRAVENOUS | Status: AC | PRN
  Administered 2021-05-24: 100 mL via INTRAVENOUS

## 2021-05-25 ENCOUNTER — Other Ambulatory Visit: Payer: Self-pay | Admitting: Family

## 2021-05-25 DIAGNOSIS — K8020 Calculus of gallbladder without cholecystitis without obstruction: Secondary | ICD-10-CM

## 2021-05-25 DIAGNOSIS — K429 Umbilical hernia without obstruction or gangrene: Secondary | ICD-10-CM

## 2021-05-26 ENCOUNTER — Encounter: Payer: Self-pay | Admitting: Family

## 2021-05-26 ENCOUNTER — Other Ambulatory Visit: Payer: Self-pay | Admitting: Family

## 2021-05-26 ENCOUNTER — Ambulatory Visit: Payer: No Typology Code available for payment source | Admitting: Family

## 2021-05-26 VITALS — BP 138/68 | HR 71 | Temp 98.5°F | Ht 67.0 in | Wt 188.4 lb

## 2021-05-26 DIAGNOSIS — K429 Umbilical hernia without obstruction or gangrene: Secondary | ICD-10-CM | POA: Diagnosis not present

## 2021-05-26 DIAGNOSIS — N400 Enlarged prostate without lower urinary tract symptoms: Secondary | ICD-10-CM | POA: Diagnosis not present

## 2021-05-26 DIAGNOSIS — R1011 Right upper quadrant pain: Secondary | ICD-10-CM | POA: Diagnosis not present

## 2021-05-26 DIAGNOSIS — N529 Male erectile dysfunction, unspecified: Secondary | ICD-10-CM

## 2021-05-26 LAB — PSA: PSA: 1.24 ng/mL (ref 0.10–4.00)

## 2021-05-26 MED ORDER — TAMSULOSIN HCL 0.4 MG PO CAPS: 0.4000 mg | ORAL_CAPSULE | Freq: Every day | ORAL | 2 refills | Status: DC

## 2021-05-26 MED ORDER — TAMSULOSIN HCL 0.4 MG PO CAPS: 0.4000 mg | ORAL_CAPSULE | Freq: Every day | ORAL | 0 refills | Status: DC

## 2021-05-26 NOTE — Progress Notes (Signed)
Casey Curtis is a 46 y.o. male with the following history as recorded in EpicCare:  Patient Active Problem List   Diagnosis Date Noted   Urinary frequency 02/05/2014   ED (erectile dysfunction) 10/25/2011   Routine general medical examination at a health care facility 10/25/2011   MIGRAINE HEADACHE 11/16/2008   ALLERGIC RHINITIS 11/16/2008   CHEST WALL PAIN, HX OF 11/16/2008    Current Outpatient Medications  Medication Sig Dispense Refill   Multiple Vitamin (MULTIVITAMIN) capsule Take 1 capsule by mouth daily.     tamsulosin (FLOMAX) 0.4 MG CAPS capsule Take 1 capsule (0.4 mg total) by mouth daily. 90 capsule 0   No current facility-administered medications for this visit.    Allergies: Augmentin [amoxicillin-pot clavulanate]  Past Medical History:  Diagnosis Date   Depression     Past Surgical History:  Procedure Laterality Date   KNEE SURGERY      Family History  Problem Relation Age of Onset   Memory loss Father    Arthritis Other    Hyperlipidemia Other    Stroke Other    Hypertension Other    Mental illness Other    Diabetes Other     Social History   Tobacco Use   Smoking status: Never   Smokeless tobacco: Former  Substance Use Topics   Alcohol use: Yes    Subjective:  Patient would like to discuss/ review recent CT results. Did show gallstones/ umbilical hernia/ diverticulosis/ borderline enlarged prostate;  Would like to go through results and better understand findings/ what is being recommended;  Does mention that he has been having RUQ pain "on and off" for the past year;    Objective:  Vitals:   05/26/21 0824  BP: 138/68  Pulse: 71  Temp: 98.5 F (36.9 C)  TempSrc: Oral  SpO2: 97%  Weight: 188 lb 6.4 oz (85.5 kg)  Height: 5\' 7"  (1.702 m)    General: Well developed, well nourished, in no acute distress  Skin : Warm and dry.  Head: Normocephalic and atraumatic  Lungs: Respirations unlabored;  Neurologic: Alert and oriented; speech  intact; face symmetrical; moves all extremities well; CNII-XII intact without focal deficit   Assessment:  1. Erectile dysfunction, unspecified erectile dysfunction type   2. Enlarged prostate   3. RUQ abdominal pain   4. Umbilical hernia without obstruction and without gangrene     Plan:  Update labs today to include PSA and testosterone; trial of Flomax; refer to urology; 3.   CT does show gallstones- will have patient discuss with surgeon regarding treatment options; dietary recommendations discussed; 4.   ? Significance- discuss further with surgeon;   Time spent 30 minutes  This visit occurred during the SARS-CoV-2 public health emergency.  Safety protocols were in place, including screening questions prior to the visit, additional usage of staff PPE, and extensive cleaning of exam room while observing appropriate contact time as indicated for disinfecting solutions.    No follow-ups on file.  Orders Placed This Encounter  Procedures   PSA   Testosterone Total,Free,Bio, Males-([facility])   Ambulatory referral to Urology    Referral Priority:   Routine    Referral Type:   Consultation    Referral Reason:   Specialty Services Required    Requested Specialty:   Urology    Number of Visits Requested:   1    Requested Prescriptions   Signed Prescriptions Disp Refills   tamsulosin (FLOMAX) 0.4 MG CAPS capsule 90 capsule 0  Sig: Take 1 capsule (0.4 mg total) by mouth daily.

## 2021-05-27 LAB — TESTOSTERONE TOTAL,FREE,BIO, MALES
Albumin: 4.4 g/dL (ref 3.6–5.1)
Sex Hormone Binding: 24 nmol/L (ref 10–50)
Testosterone, Bioavailable: 126.8 ng/dL (ref 110.0–575.0)
Testosterone, Free: 63 pg/mL (ref 46.0–224.0)
Testosterone: 375 ng/dL (ref 250–827)

## 2022-04-28 ENCOUNTER — Ambulatory Visit (INDEPENDENT_AMBULATORY_CARE_PROVIDER_SITE_OTHER): Payer: No Typology Code available for payment source | Admitting: Family

## 2022-04-28 ENCOUNTER — Encounter: Payer: Self-pay | Admitting: Family

## 2022-04-28 VITALS — BP 116/62 | HR 75 | Temp 97.9°F | Resp 18 | Ht 67.0 in | Wt 182.8 lb

## 2022-04-28 DIAGNOSIS — Z113 Encounter for screening for infections with a predominantly sexual mode of transmission: Secondary | ICD-10-CM | POA: Diagnosis not present

## 2022-04-28 NOTE — Progress Notes (Signed)
  Casey Curtis is a 46 y.o. male with the following history as recorded in EpicCare:  Patient Active Problem List   Diagnosis Date Noted   Urinary frequency 02/05/2014   ED (erectile dysfunction) 10/25/2011   Routine general medical examination at a health care facility 10/25/2011   MIGRAINE HEADACHE 11/16/2008   ALLERGIC RHINITIS 11/16/2008   CHEST WALL PAIN, HX OF 11/16/2008    Current Outpatient Medications  Medication Sig Dispense Refill   Misc Natural Products (FIBER 7 PO) Take by mouth.     Multiple Vitamin (MULTIVITAMIN) capsule Take 1 capsule by mouth daily.     tamsulosin (FLOMAX) 0.4 MG CAPS capsule Take 1 capsule (0.4 mg total) by mouth daily. (Patient not taking: Reported on 04/28/2022) 30 capsule 2   No current facility-administered medications for this visit.    Allergies: Augmentin [amoxicillin-pot clavulanate]  Past Medical History:  Diagnosis Date   Depression     Past Surgical History:  Procedure Laterality Date   KNEE SURGERY      Family History  Problem Relation Age of Onset   Memory loss Father    Arthritis Other    Hyperlipidemia Other    Stroke Other    Hypertension Other    Mental illness Other    Diabetes Other     Social History   Tobacco Use   Smoking status: Never   Smokeless tobacco: Former  Substance Use Topics   Alcohol use: Yes    Subjective:  Requesting STD screen today; no symptoms or concerns for exposure; is in a new relationship with new partner and being proactive about health;   Objective:  Vitals:   04/28/22 1055  BP: 116/62  Pulse: 75  Resp: 18  Temp: 97.9 F (36.6 C)  TempSrc: Oral  SpO2: 96%  Weight: 182 lb 12.8 oz (82.9 kg)  Height: 5\' 7"  (1.702 m)    General: Well developed, well nourished, in no acute distress  Skin : Warm and dry.  Head: Normocephalic and atraumatic  Lungs: Respirations unlabored;  Neurologic: Alert and oriented; speech intact; face symmetrical; moves all extremities well; CNII-XII intact  without focal deficit   Assessment:  1. Screen for STD (sexually transmitted disease)     Plan:  Labs updated as requested; will follow up with results once available; follow up as needed.   No follow-ups on file.  Orders Placed This Encounter  Procedures   C. trachomatis/N. gonorrhoeae RNA   HIV antibody (with reflex)   RPR    Requested Prescriptions    No prescriptions requested or ordered in this encounter

## 2022-04-29 LAB — RPR: RPR Ser Ql: NONREACTIVE

## 2022-04-29 LAB — HIV ANTIBODY (ROUTINE TESTING W REFLEX): HIV 1&2 Ab, 4th Generation: NONREACTIVE

## 2022-04-29 LAB — C. TRACHOMATIS/N. GONORRHOEAE RNA
C. trachomatis RNA, TMA: NOT DETECTED
N. gonorrhoeae RNA, TMA: NOT DETECTED

## 2022-07-03 ENCOUNTER — Encounter: Payer: Self-pay | Admitting: Medical

## 2022-07-19 ENCOUNTER — Ambulatory Visit: Payer: No Typology Code available for payment source | Admitting: Medical

## 2022-07-19 VITALS — BP 120/66 | HR 65 | Temp 98.5°F | Resp 18 | Ht 67.0 in | Wt 178.2 lb

## 2022-07-19 DIAGNOSIS — R0789 Other chest pain: Secondary | ICD-10-CM

## 2022-07-19 DIAGNOSIS — Z0000 Encounter for general adult medical examination without abnormal findings: Secondary | ICD-10-CM

## 2022-07-19 DIAGNOSIS — M940 Chondrocostal junction syndrome [Tietze]: Secondary | ICD-10-CM

## 2022-07-19 DIAGNOSIS — R739 Hyperglycemia, unspecified: Secondary | ICD-10-CM | POA: Diagnosis not present

## 2022-07-19 DIAGNOSIS — R1032 Left lower quadrant pain: Secondary | ICD-10-CM

## 2022-07-19 LAB — CBC WITH DIFFERENTIAL/PLATELET
Basophils Absolute: 0 10*3/uL (ref 0.0–0.1)
Basophils Relative: 0.5 % (ref 0.0–3.0)
Eosinophils Absolute: 0.1 10*3/uL (ref 0.0–0.7)
Eosinophils Relative: 1.9 % (ref 0.0–5.0)
HCT: 43.5 % (ref 39.0–52.0)
Hemoglobin: 14.7 g/dL (ref 13.0–17.0)
Lymphocytes Relative: 24.6 % (ref 12.0–46.0)
Lymphs Abs: 1.4 10*3/uL (ref 0.7–4.0)
MCHC: 33.8 g/dL (ref 30.0–36.0)
MCV: 96.3 fl (ref 78.0–100.0)
Monocytes Absolute: 0.6 10*3/uL (ref 0.1–1.0)
Monocytes Relative: 10.8 % (ref 3.0–12.0)
Neutro Abs: 3.4 10*3/uL (ref 1.4–7.7)
Neutrophils Relative %: 62.2 % (ref 43.0–77.0)
Platelets: 232 10*3/uL (ref 150.0–400.0)
RBC: 4.51 Mil/uL (ref 4.22–5.81)
RDW: 13 % (ref 11.5–15.5)
WBC: 5.5 10*3/uL (ref 4.0–10.5)

## 2022-07-19 LAB — LIPID PANEL
Cholesterol: 195 mg/dL (ref 0–200)
HDL: 81.5 mg/dL (ref >39.00)
LDL Cholesterol: 89 mg/dL (ref 0–99)
NonHDL: 113.66
Total CHOL/HDL Ratio: 2
Triglycerides: 121 mg/dL (ref 0.0–149.0)
VLDL: 24.2 mg/dL (ref 0.0–40.0)

## 2022-07-19 LAB — HEMOGLOBIN A1C: Hgb A1c MFr Bld: 5.8 % (ref 4.6–6.5)

## 2022-07-19 LAB — COMPREHENSIVE METABOLIC PANEL
ALT: 26 U/L (ref 0–53)
AST: 16 U/L (ref 0–37)
Albumin: 4.1 g/dL (ref 3.5–5.2)
Alkaline Phosphatase: 49 U/L (ref 39–117)
BUN: 13 mg/dL (ref 6–23)
CO2: 29 mEq/L (ref 19–32)
Calcium: 9.1 mg/dL (ref 8.4–10.5)
Chloride: 107 mEq/L (ref 96–112)
Creatinine, Ser: 1.09 mg/dL (ref 0.40–1.50)
GFR: 82.43 mL/min (ref >60.00)
Glucose, Bld: 98 mg/dL (ref 70–99)
Potassium: 4.2 mEq/L (ref 3.5–5.1)
Sodium: 142 mEq/L (ref 135–145)
Total Bilirubin: 0.5 mg/dL (ref 0.2–1.2)
Total Protein: 6.5 g/dL (ref 6.0–8.3)

## 2022-07-19 MED ORDER — DICLOFENAC SODIUM 75 MG PO TBEC: 75.0000 mg | DELAYED_RELEASE_TABLET | Freq: Two times a day (BID) | ORAL | 0 refills | Status: DC

## 2022-07-19 NOTE — Patient Instructions (Addendum)
For you wellness exam today I have ordered cbc, cmp and lipid panel.  Vaccines up to date.   Recommend exercise and healthy diet.  We will let you know lab results as they come in.  Left side costochondral junction tenderness to palpation.  Pain easily reproducible.  EKG shows sinus rhythm(no ischemia seen).  Will prescribe diclofenac and see if the pain subsides.  If pain worsens or changes advise emergency department evaluation.  Left lower quadrant intermittent pain for 2 years.  Workup negative.  On exam small circular rubbery lump like a lipoma or other connective tissue.  I think is a good idea to refer you to sports medicine in our building and get opinion from Dr. Raeford Razor.  Follow-up in 2 weeks

## 2022-07-19 NOTE — Progress Notes (Addendum)
Subjective:    Patient ID: Casey Curtis, male    DOB: 1975/12/11, 47 y.o.   MRN: [id number]  HPI  Wellness exam. Pt works Materials engineer. Not working out at gym or house. Pt has 2 children. Married. Pt states eats semi healthy. Non smoker. He does drinks etoh on weekend. 2-3 weeks.Pt enjoys watching football.   Pt states ED. He used cialis and it did help.    Review of Systems  Constitutional:  Negative for chills, fatigue and fever.  HENT:  Negative for congestion and drooling.   Respiratory:  Negative for cough, chest tightness, shortness of breath and wheezing.   Cardiovascular:  Positive for chest pain. Negative for palpitations.       Atypical discomfort to chest left side off and on for few months. Pain is at rest. Also when he moves.  Pain occurring for 6 months. Occurring couple of times a week. No jaw pain. No nausea, vomiting or sweating. Pt had some discomfort last night. He states felt when layes on left side. Changed position and pain went away.  Gastrointestinal:  Positive for abdominal pain. Negative for blood in stool, constipation, nausea and vomiting.       Chronic left lower quadrant pain since 2022. [place] intermittent pain. For while stopped then came back. No associate gi symptoms. Worse with movement at time. Sometimes pain after sex. He states can feel tiny lump in area. Went to PT and ortho in past. Ct was negative.  Genitourinary:  Negative for dysuria, flank pain and frequency.       ED.  Musculoskeletal:  Negative for back pain, joint swelling and neck pain.  Skin:  Negative for color change and rash.  Neurological:  Negative for dizziness, syncope, weakness and headaches.  Hematological:  Negative for adenopathy. Does not bruise/bleed easily.  Psychiatric/Behavioral:  Negative for behavioral problems, confusion and decreased concentration.     Past Medical History:  Diagnosis Date   Depression      Social History   Socioeconomic History   Marital  status: Married    Spouse name: Not on file   Number of children: Not on file   Years of education: Not on file   Highest education level: Not on file  Occupational History   Not on file  Tobacco Use   Smoking status: Never   Smokeless tobacco: Former  Substance and Sexual Activity   Alcohol use: Yes   Drug use: No   Sexual activity: Not on file  Other Topics Concern   Not on file  Social History Narrative   Not on file   Social Determinants of Health   Financial Resource Strain: Not on file  Food Insecurity: Not on file  Transportation Needs: Not on file  Physical Activity: Not on file  Stress: Not on file  Social Connections: Not on file  Intimate Partner Violence: Not on file    Past Surgical History:  Procedure Laterality Date   KNEE SURGERY      Family History  Problem Relation Age of Onset   Memory loss Father    Arthritis Other    Hyperlipidemia Other    Stroke Other    Hypertension Other    Mental illness Other    Diabetes Other     Allergies  Allergen Reactions   Augmentin [Amoxicillin-Pot Clavulanate]     Current Outpatient Medications on File Prior to Visit  Medication Sig Dispense Refill   Misc Natural Products (FIBER 7 PO) Take by  mouth.     Multiple Vitamin (MULTIVITAMIN) capsule Take 1 capsule by mouth daily.     No current facility-administered medications on file prior to visit.    BP 120/66   Pulse 65   Temp 98.5 F (36.9 C)   Resp 18   Ht 5\' 7"  (1.702 m)   Wt 178 lb 3.2 oz (80.8 kg)   SpO2 100%   BMI 27.91 kg/m        Objective:   Physical Exam   General Mental Status- Alert. General Appearance- Not in acute distress.   Skin General: Color- Normal Color. Moisture- Normal Moisture.  Neck Carotid Arteries- Normal color. Moisture- Normal Moisture. No carotid bruits. No JVD.  Chest and Lung Exam Auscultation: Breath Sounds:-Normal.  Cardiovascular Auscultation:Rythm- Regular. Murmurs & Other Heart  Sounds:Auscultation of the heart reveals- No Murmurs.  Abdomen Inspection:-Inspeection Normal. Palpation/Percussion:Note:No mass. Palpation and Percussion of the abdomen reveal- Non Tender, Non Distended + BS, no rebound or guarding. Left lower quadrant- Eugene Garnet left groin area rubbery connecctive tissues feeling oblong tiny lump.   Neurologic Cranial Nerve exam:- CN III-XII intact(No nystagmus), symmetric smile. Drift Test:- No drift. Romberg Exam:- Negative.  Heal to Toe Gait exam:-Normal. Finger to Nose:- Normal/Intact Strength:- 5/5 equal and symmetric strength both upper and lower extremities.   Anterior chest- left side costochondral junction tender. Pain reproducible.       Assessment & Plan:   Patient Instructions  For you wellness exam today I have ordered cbc, cmp and lipid panel.  Vaccines up to date.   Recommend exercise and healthy diet.  We will let you know lab results as they come in.  Left side costochondral junction tenderness to palpation.  Pain easily reproducible.  EKG shows sinus rhythm(no ischemia seen).  Will prescribe diclofenac and see if the pain subsides.  If pain worsens or changes advise emergency department evaluation.  Left lower quadrant intermittent pain for 2 years.  Workup negative.  On exam small circular rubbery lump like a lipoma or other connective tissue.  I think is a good idea to refer you to sports medicine in our building and get opinion from Dr. Raeford Razor.  Follow-up in 2 weeks       [id number] charge as did address atypical chest/cotsochondritis, got EKG and rx diclofenac. Also referred to sports medicine for lower quadrant abdomen pain.

## 2022-07-20 ENCOUNTER — Encounter: Payer: Self-pay | Admitting: Family Medicine

## 2022-07-20 ENCOUNTER — Ambulatory Visit (INDEPENDENT_AMBULATORY_CARE_PROVIDER_SITE_OTHER): Payer: No Typology Code available for payment source | Admitting: Family Medicine

## 2022-07-20 VITALS — BP 128/88 | Ht 67.0 in | Wt 175.0 lb

## 2022-07-20 DIAGNOSIS — S3981XA Other specified injuries of abdomen, initial encounter: Secondary | ICD-10-CM | POA: Diagnosis not present

## 2022-07-20 DIAGNOSIS — M5414 Radiculopathy, thoracic region: Secondary | ICD-10-CM | POA: Insufficient documentation

## 2022-07-20 NOTE — Assessment & Plan Note (Signed)
Acute on chronic in nature.  Symptoms been ongoing for over a year.  CT of the abdomen pelvis from January 2023 were unrevealing for any changes.  Has completed a course of physical therapy with no improvement.  Continues to have pain with standing from a seated position and any activity. -Count on home exercise therapy and supportive care. -MRI of the pelvis to evaluate for sports hernia for presurgical planning

## 2022-07-20 NOTE — Patient Instructions (Signed)
Nice to meet you Please try the ab roll wheel out  You can consider compression  We'll send the MRi to [facility]  Please send me a message in MyChart with any questions or updates.  We'll setup a virtual visit once the MRi is resulted.   --Dr. Raeford Razor

## 2022-07-20 NOTE — Progress Notes (Signed)
  Casey Curtis - 47 y.o. male MRN [id number]  Date of birth: 10/15/1975  SUBJECTIVE:  Including CC & ROS.  No chief complaint on file.   Casey Curtis is a 47 y.o. male that is presenting with acute on chronic left lower abdominal/groin pain.  The pain has been ongoing for over a year.  He has completed physical therapy at [facility].  A previous CT scan was unrevealing for any muscular changes.  This affects him on a daily basis and interferes with his activities of daily living.   Review of Systems See HPI   HISTORY: Past Medical, Surgical, Social, and Family History Reviewed & Updated per EMR.   Pertinent Historical Findings include:  Past Medical History:  Diagnosis Date   Depression     Past Surgical History:  Procedure Laterality Date   KNEE SURGERY       PHYSICAL EXAM:  VS: BP 128/88   Ht 5\' 7"  (1.702 m)   Wt 175 lb (79.4 kg)   BMI 27.41 kg/m  Physical Exam Gen: NAD, alert, cooperative with exam, well-appearing MSK:  Left lower ab/left hip:  Pain with rising from a seated position. Pain with resistance to hip flexion. Palpable defect of the left lower quadrant. Tenderness of this palpable nodule in the left lower quadrant. Neurovascularly intact       ASSESSMENT & PLAN:   Sports hernia Acute on chronic in nature.  Symptoms been ongoing for over a year.  CT of the abdomen pelvis from January 2023 were unrevealing for any changes.  Has completed a course of physical therapy with no improvement.  Continues to have pain with standing from a seated position and any activity. -Count on home exercise therapy and supportive care. -MRI of the pelvis to evaluate for sports hernia for presurgical planning

## 2022-07-24 MED ORDER — TADALAFIL 5 MG PO TABS: 5.0000 mg | ORAL_TABLET | Freq: Every day | ORAL | 3 refills | Status: DC | PRN

## 2022-07-24 MED ORDER — TADALAFIL 10 MG PO TABS: 10.0000 mg | ORAL_TABLET | Freq: Every day | ORAL | 3 refills | Status: DC | PRN

## 2022-07-24 NOTE — Addendum Note (Signed)
Addended by: Anabel Halon on: 07/24/2022 08:23 AM   Modules accepted: Orders

## 2022-07-24 NOTE — Addendum Note (Signed)
Addended by: Anabel Halon on: 07/24/2022 08:24 AM   Modules accepted: Orders

## 2022-08-02 ENCOUNTER — Ambulatory Visit: Payer: No Typology Code available for payment source | Admitting: Medical

## 2022-08-02 VITALS — BP 130/74 | HR 64 | Temp 98.0°F | Resp 18 | Ht 67.0 in | Wt 178.0 lb

## 2022-08-02 DIAGNOSIS — N529 Male erectile dysfunction, unspecified: Secondary | ICD-10-CM | POA: Diagnosis not present

## 2022-08-02 DIAGNOSIS — S3981XD Other specified injuries of abdomen, subsequent encounter: Secondary | ICD-10-CM | POA: Diagnosis not present

## 2022-08-02 DIAGNOSIS — R739 Hyperglycemia, unspecified: Secondary | ICD-10-CM

## 2022-08-02 DIAGNOSIS — Z1211 Encounter for screening for malignant neoplasm of colon: Secondary | ICD-10-CM

## 2022-08-02 DIAGNOSIS — M940 Chondrocostal junction syndrome [Tietze]: Secondary | ICD-10-CM

## 2022-08-02 MED ORDER — SILDENAFIL CITRATE 100 MG PO TABS: 50.0000 mg | ORAL_TABLET | Freq: Every day | ORAL | 3 refills | Status: DC | PRN

## 2022-08-02 NOTE — Patient Instructions (Addendum)
Costochondritis  Elevated blood sugar  Sports hernia, subsequent encounter  Other orders -     Sildenafil Citrate; Take 0.5-1 tablets (50-100 mg total) by mouth daily as needed for erectile dysfunction.  Dispense: 5 tablet; Refill: 3     Costochondritis signs/symptoms resolved with diclofenac. Recommend take it easy when working out pectoral muscles.  Mild elevated sugar on labs/A1c 5.8 in prediabetic range. Recommend low sugar diet.  For ED rx'd viagra. DC cialis rx.  For probable sports hernia get mri.   Follow up 1 year wellness exam or sooner if needed.

## 2022-08-02 NOTE — Progress Notes (Signed)
Subjective:    Patient ID: Casey Curtis, male    DOB: [birth year], 47 y.o.   MRN: [id number]  HPI Pt in for follow up.  Pt did see sport med for possible sports hernia. Pt getting mri tomorrow.   Pt states his chest wall discomfort is much better. He states no pain on palpation any more. He had costochondritis type pain. I had prescribed diclofenac. No associated cardiac like symptoms.  Pt has some ED. I had prescribed cialis at pt request. He states pharmacy did not fill as prior authorization. I had written 10 mg dose. Pt willing to try viagra.  Mild elevated blood sugar with A1c of 5.8.   Review of Systems  Constitutional:  Negative for chills and fatigue.  Respiratory:  Negative for cough, chest tightness, shortness of breath and wheezing.   Cardiovascular:  Negative for chest pain, palpitations and leg swelling.  Gastrointestinal:  Negative for abdominal pain.  Genitourinary:        ED.  Musculoskeletal:  Negative for back pain.       Costochondritis type pain presently resolved.  Neurological:  Negative for dizziness, seizures and light-headedness.  Hematological:  Negative for adenopathy. Does not bruise/bleed easily.  Psychiatric/Behavioral:  Negative for behavioral problems and decreased concentration.     Past Medical History:  Diagnosis Date   Depression      Social History   Socioeconomic History   Marital status: Married    Spouse name: Not on file   Number of children: Not on file   Years of education: Not on file   Highest education level: Some college, no degree  Occupational History   Not on file  Tobacco Use   Smoking status: Never   Smokeless tobacco: Former  Substance and Sexual Activity   Alcohol use: Yes   Drug use: No   Sexual activity: Not on file  Other Topics Concern   Not on file  Social History Narrative   Not on file   Social Determinants of Health   Financial Resource Strain: Low Risk  (07/26/2022)   Overall Financial Resource  Strain (CARDIA)    Difficulty of Paying Living Expenses: Not hard at all  Food Insecurity: No Food Insecurity (07/26/2022)   Hunger Vital Sign    Worried About Running Out of Food in the Last Year: Never true    Ran Out of Food in the Last Year: Never true  Transportation Needs: No Transportation Needs (07/26/2022)   PRAPARE - Hydrologist (Medical): No    Lack of Transportation (Non-Medical): No  Physical Activity: Insufficiently Active (07/26/2022)   Exercise Vital Sign    Days of Exercise per Week: 3 days    Minutes of Exercise per Session: 30 min  Stress: Stress Concern Present (07/26/2022)   [place] [facility]    Feeling of Stress : To some extent  Social Connections: Moderately Integrated (07/26/2022)   Social Connection and Isolation Panel [NHANES]    Frequency of Communication with Friends and Family: More than three times a week    Frequency of Social Gatherings with Friends and Family: Patient declined    Attends Religious Services: Never    Marine scientist or Organizations: Yes    Attends Archivist Meetings: 1 to 4 times per year    Marital Status: Married  Human resources officer Violence: Not on file    Past Surgical History:  Procedure Laterality Date  KNEE SURGERY      Family History  Problem Relation Age of Onset   Memory loss Father    Arthritis Other    Hyperlipidemia Other    Stroke Other    Hypertension Other    Mental illness Other    Diabetes Other     Allergies  Allergen Reactions   Augmentin [Amoxicillin-Pot Clavulanate]     Current Outpatient Medications on File Prior to Visit  Medication Sig Dispense Refill   diclofenac (VOLTAREN) 75 MG EC tablet Take 1 tablet (75 mg total) by mouth 2 (two) times daily. 20 tablet 0   Misc Natural Products (FIBER 7 PO) Take by mouth.     Multiple Vitamin (MULTIVITAMIN) capsule Take 1 capsule by mouth daily.      No current facility-administered medications on file prior to visit.    BP 130/74   Pulse 64   Temp 98 F (36.7 C)   Resp 18   Ht 5\' 7"  (1.702 m)   Wt 178 lb (80.7 kg)   SpO2 97%   BMI 27.88 kg/m        Objective:   Physical Exam  General Mental Status- Alert. General Appearance- Not in acute distress.   Skin General: Color- Normal Color. Moisture- Normal Moisture.  Neck Carotid Arteries- Normal color. Moisture- Normal Moisture. No carotid bruits. No JVD.  Chest and Lung Exam Auscultation: Breath Sounds:-Normal.  Cardiovascular Auscultation:Rythm- Regular. Murmurs & Other Heart Sounds:Auscultation of the heart reveals- No Murmurs.  Abdomen Inspection:-Inspeection Normal. Palpation/Percussion:Note:No mass. Palpation and Percussion of the abdomen reveal- Non Tender, Non Distended + BS, no rebound or guarding.   Neurologic Cranial Nerve exam:- CN III-XII intact(No nystagmus), symmetric smile. Strength:- 5/5 equal and symmetric strength both upper and lower extremities.   Anterior chest- no costochondral junction tenderness to palpation.      Assessment & Plan:  Costochondritis  Elevated blood sugar  Sports hernia, subsequent encounter  Other orders -     Sildenafil Citrate; Take 0.5-1 tablets (50-100 mg total) by mouth daily as needed for erectile dysfunction.  Dispense: 5 tablet; Refill: 3  Costochondritis signs/symptoms resolved with diclofenac. Recommend take it easy when working out pectoral muscles.  Mild elevated sugar on labs/A1c 5.8 in prediabetic range. Recommend low sugar diet.  For ED rx'd viagra. DC cialis rx.  For probable sports hernia get mri.   Follow up 1 year wellness exam or sooner if needed.  Mackie Pai, PA-C

## 2022-08-03 ENCOUNTER — Telehealth: Payer: Self-pay

## 2022-08-03 ENCOUNTER — Other Ambulatory Visit (HOSPITAL_COMMUNITY): Payer: Self-pay

## 2022-08-03 ENCOUNTER — Ambulatory Visit
Admission: RE | Admit: 2022-08-03 | Discharge: 2022-08-03 | Disposition: A | Discharge status: Home / Self Care | Payer: No Typology Code available for payment source | Source: Ambulatory Visit | Attending: Family Medicine | Admitting: Family Medicine

## 2022-08-03 DIAGNOSIS — S3981XA Other specified injuries of abdomen, initial encounter: Secondary | ICD-10-CM

## 2022-08-03 NOTE — Telephone Encounter (Signed)
Patient Advocate Encounter  Prior authorization for Sildenafil Citrate 100MG  tablets submitted and APPROVED on 08/03/22. Insurance: [employer]  Key [id number] Effective: 08/03/22 - 08/02/25

## 2022-08-08 ENCOUNTER — Telehealth (INDEPENDENT_AMBULATORY_CARE_PROVIDER_SITE_OTHER): Payer: No Typology Code available for payment source | Admitting: Family Medicine

## 2022-08-08 ENCOUNTER — Encounter: Payer: Self-pay | Admitting: Family Medicine

## 2022-08-08 VITALS — Ht 67.0 in | Wt 178.0 lb

## 2022-08-08 DIAGNOSIS — M5414 Radiculopathy, thoracic region: Secondary | ICD-10-CM | POA: Diagnosis not present

## 2022-08-08 MED ORDER — PREDNISONE 5 MG PO TABS: ORAL_TABLET | ORAL | 0 refills | Status: DC

## 2022-08-08 NOTE — Progress Notes (Signed)
Virtual Visit via Video Note  I connected with Casey Curtis on 08/08/22 at  8:00 AM EDT by a video enabled telemedicine application and verified that I am speaking with the correct person using two identifiers.  Location: Patient: home Provider: office   I discussed the limitations of evaluation and management by telemedicine and the availability of in person appointments. The patient expressed understanding and agreed to proceed.  History of Present Illness:  Casey Curtis is a 47 year old male that is following up after the MRI of his pelvis.  This was not demonstrating any structural changes.  He continues to have pain and reports that the pain appears to be radiating from his back.  He was having fasciculations of the left lower back.  Observations/Objective:   Assessment and Plan:  Thoracic radiculopathy: MRI of the pelvis did not demonstrating a sports hernia.  Continues to have pain and reports the pain coming from his back with fasciculations in the left lower back. -Counseled on home exercise therapy and supportive care. -Prednisone. -Could consider local injection versus further imaging of the spine.  Follow Up Instructions:    I discussed the assessment and treatment plan with the patient. The patient was provided an opportunity to ask questions and all were answered. The patient agreed with the plan and demonstrated an understanding of the instructions.   The patient was advised to call back or seek an in-person evaluation if the symptoms worsen or if the condition fails to improve as anticipated.    Clearance Coots, MD

## 2022-08-08 NOTE — Assessment & Plan Note (Signed)
MRI of the pelvis did not demonstrating a sports hernia.  Continues to have pain and reports the pain coming from his back with fasciculations in the left lower back. -Counseled on home exercise therapy and supportive care. -Prednisone. -Could consider local injection versus further imaging of the spine.

## 2022-08-21 ENCOUNTER — Encounter: Payer: Self-pay | Admitting: *Deleted

## 2022-09-22 IMAGING — CT CT ABD-PELV W/ CM
1 of 3 series · 14 of 32 positions shown, 19 images · IV contrast (APPLIED)
Comparison: None.

CLINICAL DATA: Left-sided lower abdominal pain. Possible lifting
injury.

EXAM:
CT ABDOMEN AND PELVIS WITH CONTRAST
TECHNIQUE: Multidetector CT imaging of the abdomen and pelvis was performed
using the standard protocol following bolus administration of
intravenous contrast.

[Series 2: abd/pelvis w/cm · axial · 0.77mm/px · z∈[-425,-20]mm · 14 of 93 slices shown, 19 images]
[im 6/93  soft-tissue]
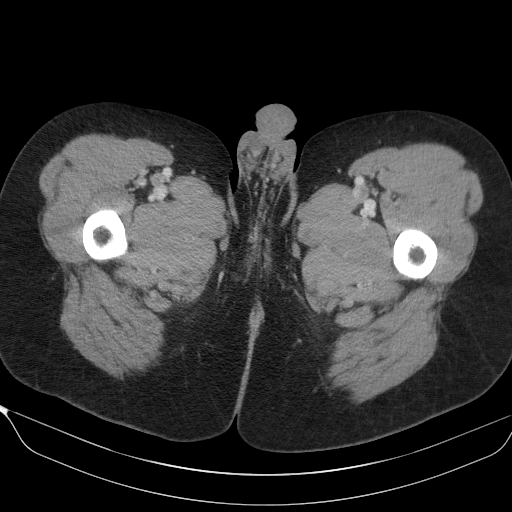
[im 6/93  bone]
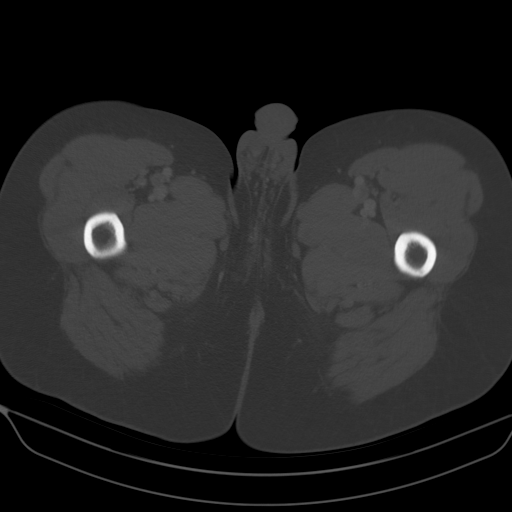
[im 12/93  soft-tissue]
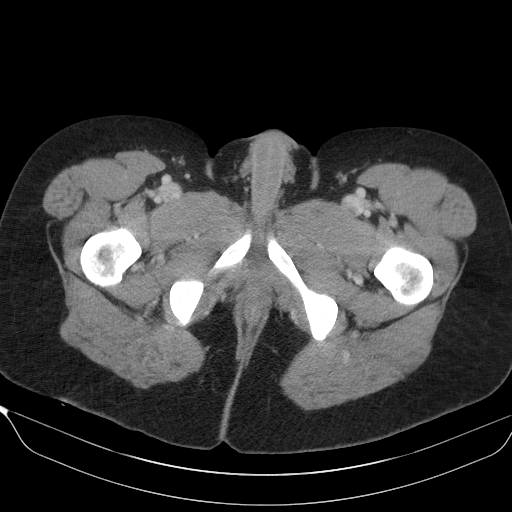
[im 18/93  soft-tissue]
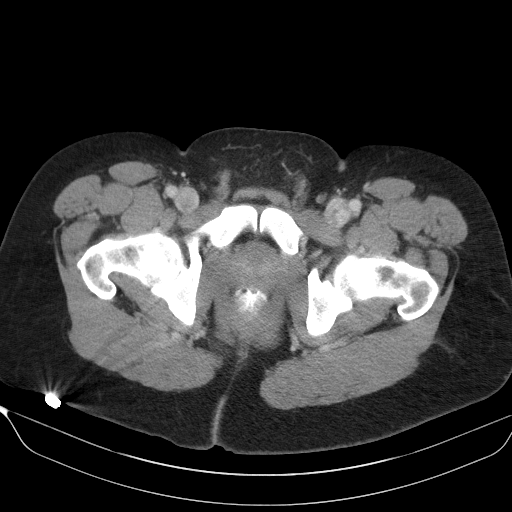
[im 29/93  soft-tissue]
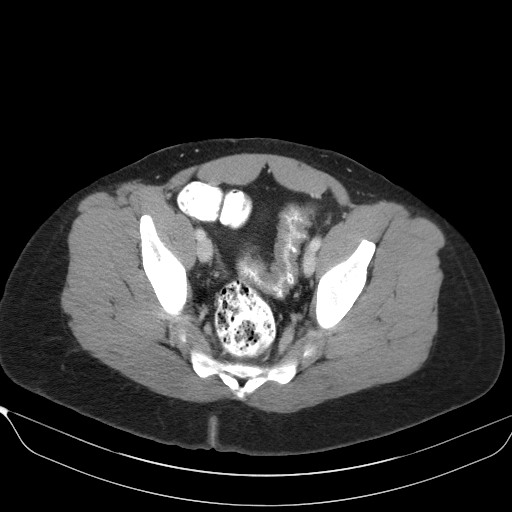
[im 35/93  soft-tissue]
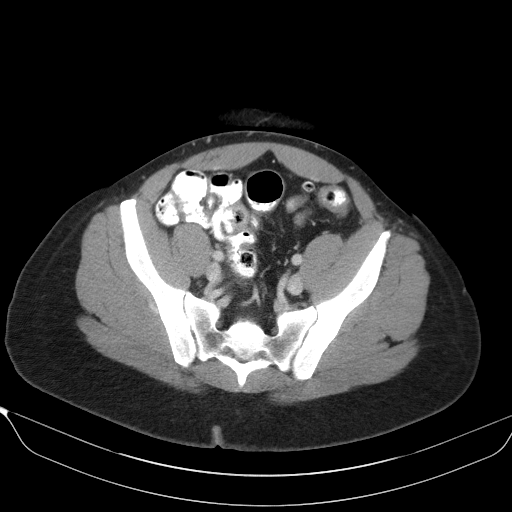
[im 41/93  soft-tissue]
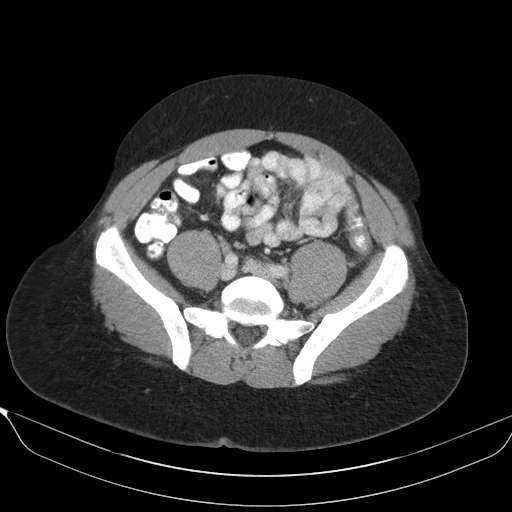
[im 47/93  soft-tissue]
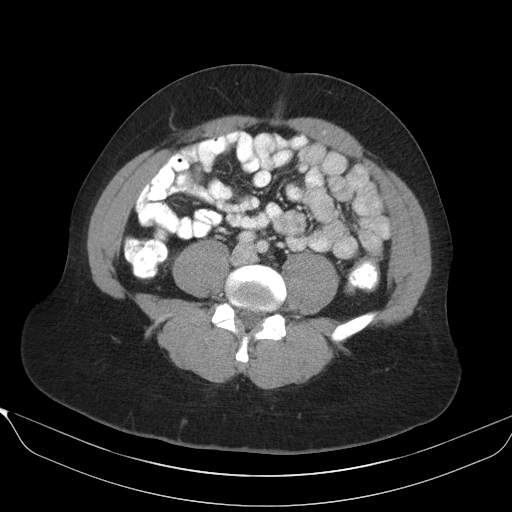
[im 52/93  soft-tissue]
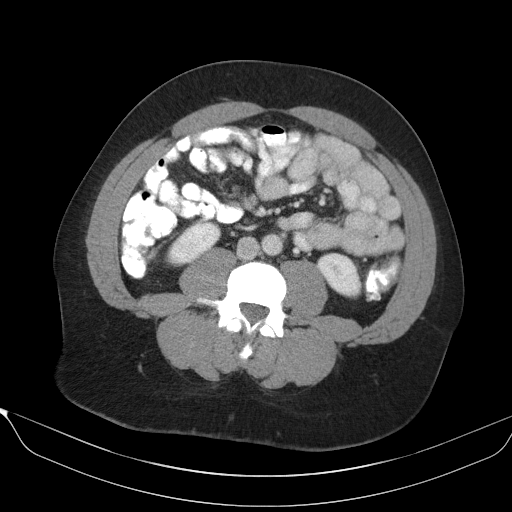
[im 58/93  soft-tissue]
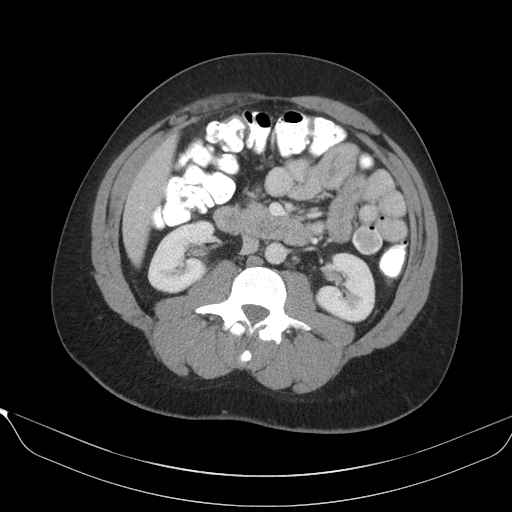
[im 58/93  bone]
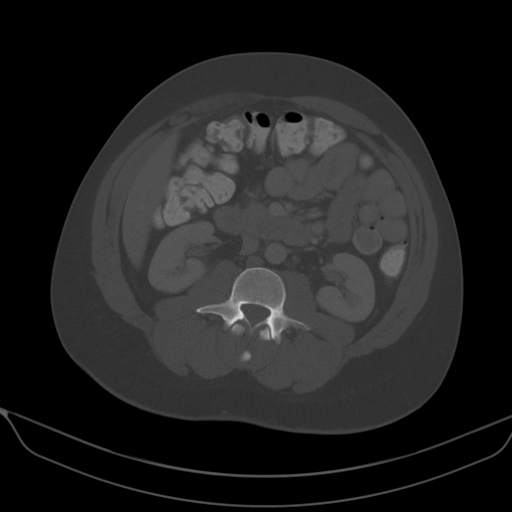
[im 64/93  soft-tissue]
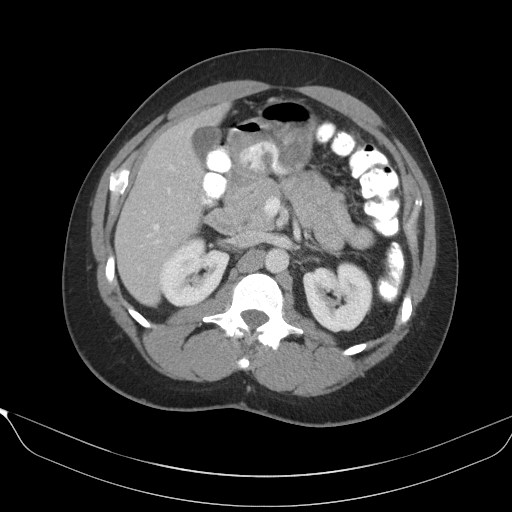
[im 70/93  lung]
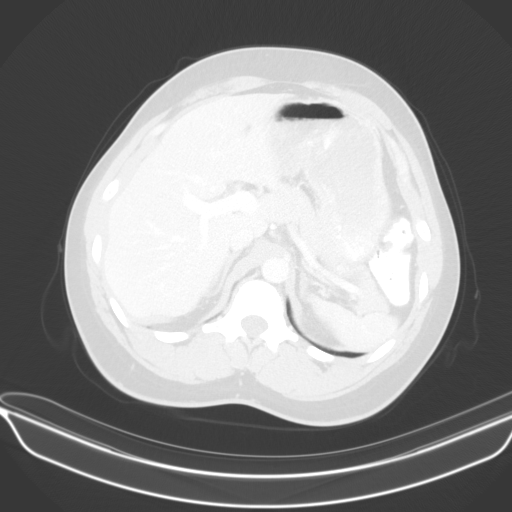
[im 75/93  soft-tissue]
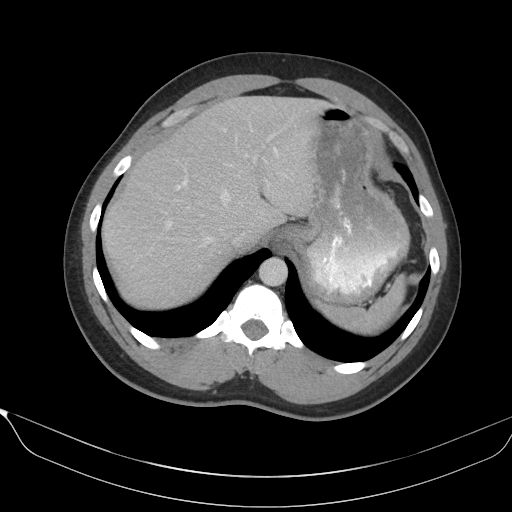
[im 75/93  lung]
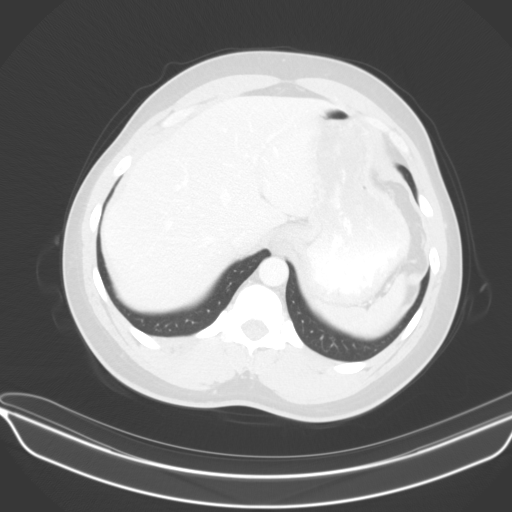
[im 81/93  soft-tissue]
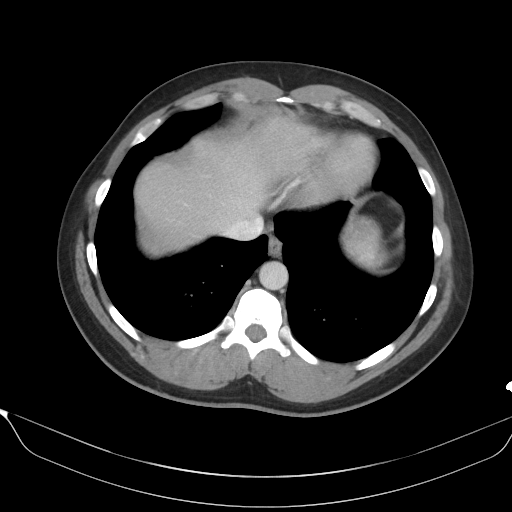
[im 81/93  lung]
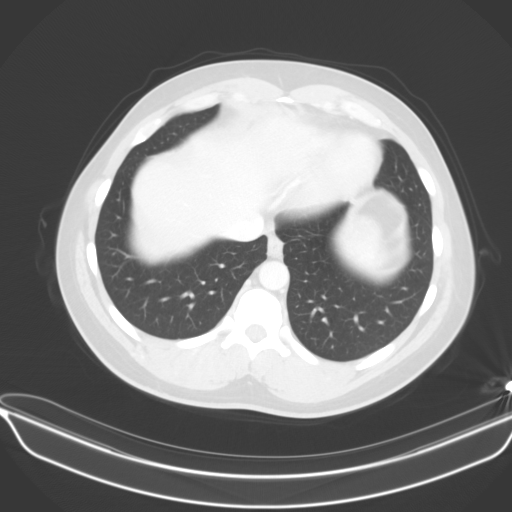
[im 87/93  soft-tissue]
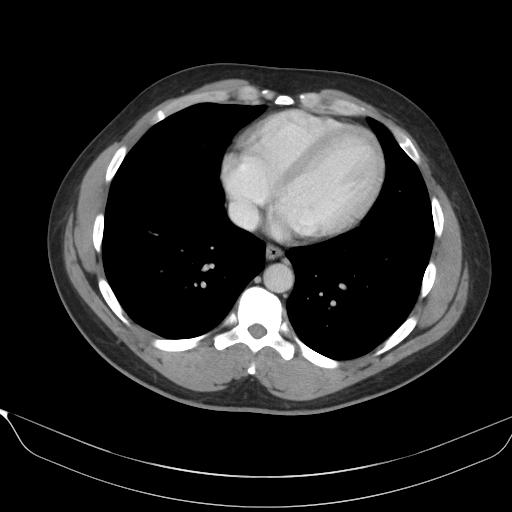
[im 87/93  lung]
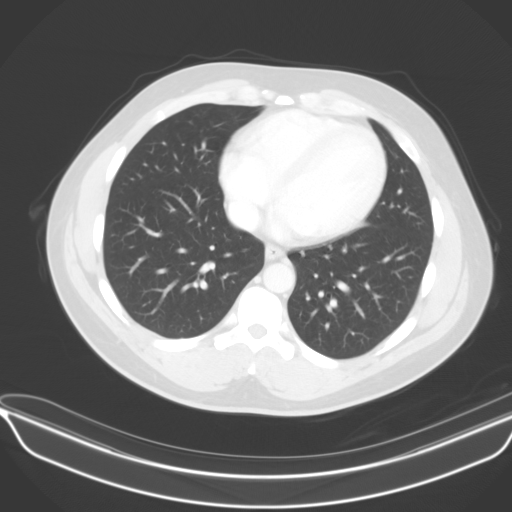

[14 of 32 positions shown; findings below may reference images not displayed]

RADIATION DOSE REDUCTION: This exam was performed according to the
departmental dose-optimization program which includes automated
exposure control, adjustment of the mA and/or kV according to
patient size and/or use of iterative reconstruction technique.

CONTRAST:  100mL VT73UM-9AA IOPAMIDOL (VT73UM-9AA) INJECTION 61%
FINDINGS: Lower chest: No acute abnormality.

Hepatobiliary: No focal liver abnormality is seen. Several small
gallstones. No gallbladder wall thickening or biliary dilatation.

Pancreas: Unremarkable. No pancreatic ductal dilatation or
surrounding inflammatory changes.

Spleen: Normal in size without focal abnormality.

Adrenals/Urinary Tract: Adrenal glands are unremarkable. Kidneys are
normal, without renal calculi, focal lesion, or hydronephrosis.
Bladder is unremarkable.

Stomach/Bowel: Stomach is within normal limits. Mild left-sided
colonic diverticulosis. Apparent wall thickening of the sigmoid
colon is likely due to underdistension as there are no surrounding
inflammatory changes. No obstruction. Normal appendix.

Vascular/Lymphatic: No significant vascular findings are present. No
enlarged abdominal or pelvic lymph nodes.

Reproductive: Borderline prostatomegaly.

Other: Tiny fat containing umbilical hernia. No inguinal hernia. No
abdominopelvic ascites. No pneumoperitoneum.

Musculoskeletal: No acute or significant osseous findings.
IMPRESSION: 1. No acute intra-abdominal process.  No inguinal hernia.
2. Cholelithiasis.
3. Mild left-sided colonic diverticulosis.

## 2023-02-22 ENCOUNTER — Encounter: Payer: Self-pay | Admitting: Medical

## 2023-02-22 NOTE — Addendum Note (Signed)
Addended by: Gwenevere Abbot on: 02/22/2023 02:59 PM   Modules accepted: Orders

## 2023-03-14 ENCOUNTER — Ambulatory Visit (AMBULATORY_SURGERY_CENTER): Payer: No Typology Code available for payment source | Admitting: *Deleted

## 2023-03-14 VITALS — Ht 67.0 in | Wt 157.0 lb

## 2023-03-14 DIAGNOSIS — Z1211 Encounter for screening for malignant neoplasm of colon: Secondary | ICD-10-CM

## 2023-03-14 MED ORDER — NA SULFATE-K SULFATE-MG SULF 17.5-3.13-1.6 GM/177ML PO SOLN: 1.0000 | Freq: Once | ORAL | 0 refills | Status: DC

## 2023-03-14 NOTE — Progress Notes (Signed)
Pt's name and DOB verified at the beginning of the pre-visit wit 2 identifiers  Pt denies any difficulty with ambulating,sitting, laying down or rolling side to side  Gave both [facility] main # and MD on call # prior to instructions.   No egg or soy allergy known to patient   No issues known to pt with past sedation with any surgeries or procedures  Pt denies having issues being intubated  Pt has no issues moving head neck or swallowing  No FH of Malignant Hyperthermia  Pt is not on diet pills or shots  Pt is not on home 02   Pt is not on blood thinners   Pt denies issues with constipation   Pt is not on dialysis  Pt denise any abnormal heart rhythms   Pt denies any upcoming cardiac testing  Pt encouraged to use to use Singlecare or Goodrx to reduce cost   Patient's chart reviewed by Cathlyn Parsons CNRA prior to pre-visit and patient appropriate for the [facility].  Pre-visit completed and red dot placed by patient's name on their procedure day (on provider's schedule).  .  Visit by phone  Pt states weight is 157 lb  Instructed pt why it is important to and  to call if they have any changes in health or new medications. Directed them to the # given and on instructions.     Instructions reviewed with pt and pt states understanding. Instructed to review again prior to procedure. Pt states they will.   Instructions sent by mail with coupon and by my chart

## 2023-03-20 ENCOUNTER — Ambulatory Visit: Payer: No Typology Code available for payment source | Admitting: Medical

## 2023-03-20 ENCOUNTER — Other Ambulatory Visit (HOSPITAL_COMMUNITY)
Admission: RE | Admit: 2023-03-20 | Discharge: 2023-03-20 | Disposition: A | Discharge status: Home / Self Care | Payer: No Typology Code available for payment source | Source: Ambulatory Visit | Attending: Medical | Admitting: Medical

## 2023-03-20 VITALS — BP 111/73 | HR 70 | Ht 67.0 in | Wt 166.0 lb

## 2023-03-20 DIAGNOSIS — Z125 Encounter for screening for malignant neoplasm of prostate: Secondary | ICD-10-CM | POA: Diagnosis not present

## 2023-03-20 DIAGNOSIS — Z113 Encounter for screening for infections with a predominantly sexual mode of transmission: Secondary | ICD-10-CM | POA: Diagnosis present

## 2023-03-20 DIAGNOSIS — R7989 Other specified abnormal findings of blood chemistry: Secondary | ICD-10-CM

## 2023-03-20 DIAGNOSIS — R634 Abnormal weight loss: Secondary | ICD-10-CM

## 2023-03-20 DIAGNOSIS — R739 Hyperglycemia, unspecified: Secondary | ICD-10-CM

## 2023-03-20 DIAGNOSIS — R972 Elevated prostate specific antigen [PSA]: Secondary | ICD-10-CM

## 2023-03-20 NOTE — Patient Instructions (Addendum)
Sexually Transmitted Disease (STD) Screening Request for STD screening due to new sexual partner in December of last year and recent rekindling of relationship with wife. No symptoms of STDs reported. -Collect urine sample for gonorrhea, chlamydia, and trichomonas testing. -Draw blood for syphilis and HIV screening.  Unintentional Weight Loss Significant weight loss reported over the past year, from 179 lbs to 157 lbs. Changes in diet and stress mentioned as potential contributing factors. No other alarming symptoms reported. -Check metabolic panel and Hemoglobin Q6V to assess for potential metabolic or endocrine causes. -Complete blood count (CBC) to rule out hematologic causes. -Encourage patient to monitor weight closely and attempt to increase caloric intake to assess ability to gain weight.  Epididymal Cyst History of epididymal cyst with recent intermittent discomfort in the area. No other alarming symptoms reported. -Continue monitoring symptoms and report any changes or worsening of discomfort. -will follow labs. If + or if epidymal area hurts constant then consider rocephin 1 gram and doxycycline oral tabs.  Prostate Cancer Screening Patient is 47 years old with no reported symptoms of prostate issues. -Draw blood for Prostate-Specific Antigen (PSA) testing.  Colon Cancer Screening Scheduled colonoscopy on 04/20/2023. -Encourage patient to attend scheduled colonoscopy.  Tobacco and Marijuana Use Reports smoking cigars weekly for the past seven years and occasional marijuana use. -Encourage cessation or reduction of tobacco and marijuana use for overall health improvement. May refer to urologist after lab review.   Follow up date to be determined after lab review.Marland Kitchen

## 2023-03-20 NOTE — Progress Notes (Signed)
Subjective:    Patient ID: Casey Curtis, male    DOB: 1975-06-02, 47 y.o.   MRN: [id number]  HPI Discussed the use of AI scribe software for clinical note transcription with the patient, who gave verbal consent to proceed.  History of Present Illness   The patient, with a history of epididymal cyst, presents for sexually transmitted disease (STD) screening, approximately eleven months after his last screening. He reports having a new sexual partner in December of the previous year, with whom he was intimate twice. Since then, he has not had any other sexual partners. The patient and his spouse, from whom he had been separated, recently reconciled and were intimate for the first time in about four months. Following this, the spouse developed a urinary tract infection (UTI). The patient denies any symptoms of burning during urination.  The patient has noticed a white, odorless discharge from his penis, which has been ongoing for several months. He describes the discharge as periodic and minimal in volume. He also reports occasional achiness in the area of a previous epididymal cyst. The patient denies any constant pain, stating it comes and goes.  The patient has also experienced significant weight loss, dropping from 179 pounds to 157 pounds. He attributes this to changes in diet, reduced appetite, and stress related to marital issues. He reports no changes in smoking habits, which include weekly cigar use and occasional marijuana use.  The patient is scheduled for a colorectal cancer screening and has an upcoming colonoscopy. He has noticed that his clothes have become baggy due to the weight loss. He has not noticed any changes in his weight or health related to his smoking habits.        Review of Systems See hpi    Objective:   Physical Exam  General- No acute distress. Pleasant patient. Neck- Full range of motion, no jvd Lungs- Clear, even and unlabored. Heart- regular rate and  rhythm. Neurologic- CNII- XII grossly intact.  Abdomen- soft, nontender, nondistender, +bs, no rebound or guarding  Gential exam- no dc from penis. Rt testicle normal. Left testicle mild palpable epidiymis as had before and US done.     Assessment & Plan:   Assessment and Plan    Sexually Transmitted Disease (STD) Screening Request for STD screening due to new sexual partner in December of last year and recent rekindling of relationship with wife. No symptoms of STDs reported. -Collect urine sample for gonorrhea, chlamydia, and trichomonas testing. -Draw blood for syphilis and HIV screening.  Unintentional Weight Loss Significant weight loss reported over the past year, from 179 lbs to 157 lbs. Changes in diet and stress mentioned as potential contributing factors. No other alarming symptoms reported. -Check metabolic panel and Hemoglobin W0J to assess for potential metabolic or endocrine causes. -Complete blood count (CBC) to rule out hematologic causes. -Encourage patient to monitor weight closely and attempt to increase caloric intake to assess ability to gain weight.  Epididymal Cyst History of epididymal cyst with recent intermittent discomfort in the area. No other alarming symptoms reported. -Continue monitoring symptoms and report any changes or worsening of discomfort.  Prostate Cancer Screening Patient is 47 years old with no reported symptoms of prostate issues. -Draw blood for Prostate-Specific Antigen (PSA) testing.  Colon Cancer Screening Scheduled colonoscopy on 04/20/2023. -Encourage patient to attend scheduled colonoscopy.  Tobacco and Marijuana Use Reports smoking cigars weekly for the past seven years and occasional marijuana use. -Encourage cessation or reduction of tobacco and marijuana use  for overall health improvement. May refer to urologist after lab review.   Follow up date to be determined after lab review.Esperanza Richters, PA-C

## 2023-03-21 LAB — CBC WITH DIFFERENTIAL/PLATELET
Basophils Absolute: 0.1 10*3/uL (ref 0.0–0.1)
Basophils Relative: 1.1 % (ref 0.0–3.0)
Eosinophils Absolute: 0.1 10*3/uL (ref 0.0–0.7)
Eosinophils Relative: 1 % (ref 0.0–5.0)
HCT: 43.1 % (ref 39.0–52.0)
Hemoglobin: 14.2 g/dL (ref 13.0–17.0)
Lymphocytes Relative: 29.8 % (ref 12.0–46.0)
Lymphs Abs: 1.6 10*3/uL (ref 0.7–4.0)
MCHC: 33.1 g/dL (ref 30.0–36.0)
MCV: 98.7 fL (ref 78.0–100.0)
Monocytes Absolute: 0.6 10*3/uL (ref 0.1–1.0)
Monocytes Relative: 11.1 % (ref 3.0–12.0)
Neutro Abs: 3.1 10*3/uL (ref 1.4–7.7)
Neutrophils Relative %: 57 % (ref 43.0–77.0)
Platelets: 236 10*3/uL (ref 150.0–400.0)
RBC: 4.37 Mil/uL (ref 4.22–5.81)
RDW: 13.1 % (ref 11.5–15.5)
WBC: 5.4 10*3/uL (ref 4.0–10.5)

## 2023-03-21 LAB — COMPREHENSIVE METABOLIC PANEL
ALT: 19 U/L (ref 0–53)
AST: 17 U/L (ref 0–37)
Albumin: 4.4 g/dL (ref 3.5–5.2)
Alkaline Phosphatase: 54 U/L (ref 39–117)
BUN: 9 mg/dL (ref 6–23)
CO2: 29 meq/L (ref 19–32)
Calcium: 9.2 mg/dL (ref 8.4–10.5)
Chloride: 102 meq/L (ref 96–112)
Creatinine, Ser: 1.01 mg/dL (ref 0.40–1.50)
GFR: 89.9 mL/min (ref >60.00)
Glucose, Bld: 90 mg/dL (ref 70–99)
Potassium: 4.2 meq/L (ref 3.5–5.1)
Sodium: 140 meq/L (ref 135–145)
Total Bilirubin: 0.5 mg/dL (ref 0.2–1.2)
Total Protein: 6.9 g/dL (ref 6.0–8.3)

## 2023-03-21 LAB — PSA: PSA: 3.8 ng/mL (ref 0.10–4.00)

## 2023-03-21 LAB — HEMOGLOBIN A1C: Hgb A1c MFr Bld: 5.8 % (ref 4.6–6.5)

## 2023-03-21 LAB — RPR: RPR Ser Ql: NONREACTIVE

## 2023-03-21 LAB — HIV ANTIBODY (ROUTINE TESTING W REFLEX): HIV 1&2 Ab, 4th Generation: NONREACTIVE

## 2023-03-21 NOTE — Addendum Note (Signed)
Addended by: Gwenevere Abbot on: 03/21/2023 12:24 PM   Modules accepted: Orders

## 2023-03-22 LAB — URINE CYTOLOGY ANCILLARY ONLY
Chlamydia: NEGATIVE
Comment: NEGATIVE
Comment: NEGATIVE
Comment: NORMAL
Neisseria Gonorrhea: NEGATIVE
Trichomonas: NEGATIVE

## 2023-03-28 ENCOUNTER — Encounter: Payer: Self-pay | Admitting: Urology

## 2023-03-28 ENCOUNTER — Ambulatory Visit: Payer: No Typology Code available for payment source | Admitting: Urology

## 2023-03-28 VITALS — BP 129/84 | HR 76 | Ht 67.0 in | Wt 156.0 lb

## 2023-03-28 DIAGNOSIS — R972 Elevated prostate specific antigen [PSA]: Secondary | ICD-10-CM | POA: Diagnosis not present

## 2023-03-28 DIAGNOSIS — N419 Inflammatory disease of prostate, unspecified: Secondary | ICD-10-CM | POA: Insufficient documentation

## 2023-03-28 DIAGNOSIS — R399 Unspecified symptoms and signs involving the genitourinary system: Secondary | ICD-10-CM

## 2023-03-28 DIAGNOSIS — N5089 Other specified disorders of the male genital organs: Secondary | ICD-10-CM

## 2023-03-28 LAB — URINALYSIS, ROUTINE W REFLEX MICROSCOPIC
Bilirubin, UA: NEGATIVE
Glucose, UA: NEGATIVE
Ketones, UA: NEGATIVE
Leukocytes,UA: NEGATIVE
Nitrite, UA: NEGATIVE
Protein,UA: NEGATIVE
RBC, UA: NEGATIVE
Specific Gravity, UA: 1.025 (ref 1.005–1.030)
Urobilinogen, Ur: 0.2 mg/dL (ref 0.2–1.0)
pH, UA: 7 (ref 5.0–7.5)

## 2023-03-28 MED ORDER — SULFAMETHOXAZOLE-TRIMETHOPRIM 800-160 MG PO TABS: 1.0000 | ORAL_TABLET | Freq: Two times a day (BID) | ORAL | 0 refills | Status: AC

## 2023-03-28 NOTE — Progress Notes (Signed)
Assessment: 1. Prostatitis, unspecified prostatitis type   2. Rising PSA level   3. Epididymal mass, left     Plan: I personally reviewed the patient's chart including provider notes, and lab results. His symptoms and exam are consistent with prostatitis. The increase in his PSA is also very likely due to the prostatitis.  I would expect this to return to normal after treatment. Diagnosis and management of prostatitis discussed with the patient. Recommend a course of Bactrim DS twice daily x 21 days. Recommend repeat scrotal ultrasound for evaluation of the left epididymal mass. Return to office in 4 weeks.   Chief Complaint:  Chief Complaint  Patient presents with   Rising PSA level     History of Present Illness:  Casey Curtis is a 47 y.o. male who is seen in consultation from [place], [place], [place] for evaluation of rising PSA, left epididymal mass, and lower urinary tract symptoms.  He was recently found to have a rising PSA value.  No prior history of elevated PSA.  No prior prostate biopsy.  No family history of prostate cancer. PSA results: 7/21 1.33 1/23 1.24 11/24 3.80  He reports a history of a left epididymal cyst.  This was initially diagnosed in 2021 with a scrotal ultrasound done for evaluation of left testicular pain.  The scrotal ultrasound from 11/04/2019 showed normal testes bilaterally and a 10 x 8 x 9 mm heterogeneous mass with a large area of slight hyperechogenicity in the body of the left epididymis with internal blood flow.  He was evaluated by Dr. Marlou Porch at [facility] in July 2021.  Follow-up with repeat scrotal ultrasound was recommended.  The patient reports that he has not had any pain or noticed any change in the size of the epididymal mass until recently.  He has had some intermittent discomfort in the left epididymal area during the past 2-3 weeks.  He has not appreciated a change in the size of the mass.  He is also recently noted some  intermittent pain in the rectal area as well as 2-3 episodes of dysuria.  He has had some discharge from the urethral meatus.  He reports an episode of pain following ejaculation recently.  No gross hematuria or flank pain. IPSS = 4.  Past Medical History:  Past Medical History:  Diagnosis Date   Arthritis    Costochondritis    Depression    Gall stones    Pinched nerve    In back   Vasculitis (HCC)     Past Surgical History:  Past Surgical History:  Procedure Laterality Date   KNEE SURGERY      Allergies:  Allergies  Allergen Reactions   Amoxicillin-Pot Clavulanate Nausea And Vomiting and Nausea Only    Family History:  Family History  Problem Relation Age of Onset   Memory loss Father    Colon cancer Maternal Uncle    Arthritis Other    Hyperlipidemia Other    Stroke Other    Hypertension Other    Mental illness Other    Diabetes Other    Colon polyps Neg Hx    Esophageal cancer Neg Hx    Stomach cancer Neg Hx    Rectal cancer Neg Hx     Social History:  Social History   Tobacco Use   Smoking status: Former    Types: Cigarettes   Smokeless tobacco: Never  Vaping Use   Vaping status: Never Used  Substance Use Topics   Alcohol use:  Yes   Drug use: Not Currently    Types: Marijuana    Review of symptoms:  Constitutional:  Negative for unexplained weight loss, night sweats, fever, chills ENT:  Negative for nose bleeds, sinus pain, painful swallowing CV:  Negative for chest pain, shortness of breath, exercise intolerance, palpitations, loss of consciousness Resp:  Negative for cough, wheezing, shortness of breath GI:  Negative for nausea, vomiting, diarrhea, bloody stools GU:  Positives noted in HPI; otherwise negative for gross hematuria,  urinary incontinence Neuro:  Negative for seizures, poor balance, limb weakness, slurred speech Psych:  Negative for lack of energy, depression, anxiety Endocrine:  Negative for polydipsia, polyuria, symptoms of  hypoglycemia (dizziness, hunger, sweating) Hematologic:  Negative for anemia, purpura, petechia, prolonged or excessive bleeding, use of anticoagulants  Allergic:  Negative for difficulty breathing or choking as a result of exposure to anything; no shellfish allergy; no allergic response (rash/itch) to materials, foods  Physical exam: BP 129/84   Pulse 76   Ht 5\' 7"  (1.702 m)   Wt 156 lb (70.8 kg)   BMI 24.43 kg/m  GENERAL APPEARANCE:  Well appearing, well developed, well nourished, NAD HEENT: Atraumatic, Normocephalic, oropharynx clear. NECK: Supple without lymphadenopathy or thyromegaly. LUNGS: Clear to auscultation bilaterally. HEART: Regular Rate and Rhythm without murmurs, gallops, or rubs. ABDOMEN: Soft, non-tender, No Masses. EXTREMITIES: Moves all extremities well.  Without clubbing, cyanosis, or edema. NEUROLOGIC:  Alert and oriented x 3, normal gait, CN II-XII grossly intact.  MENTAL STATUS:  Appropriate. BACK:  Non-tender to palpation.  No CVAT SKIN:  Warm, dry and intact.   GU: Penis:  circumcised Meatus: Normal Scrotum: no erythema or edema Testis: normal without masses bilateral Epididymis: small nodule palpated on left epididymis Prostate: 30 g, tender, no nodules Rectum: Normal tone,  no masses or tenderness   Results: U/A: negative

## 2023-04-02 ENCOUNTER — Encounter: Payer: Self-pay | Admitting: Internal Medicine

## 2023-04-02 ENCOUNTER — Ambulatory Visit (HOSPITAL_BASED_OUTPATIENT_CLINIC_OR_DEPARTMENT_OTHER)
Admission: RE | Admit: 2023-04-02 | Discharge: 2023-04-02 | Disposition: A | Discharge status: Home / Self Care | Payer: No Typology Code available for payment source | Source: Ambulatory Visit | Attending: Urology | Admitting: Urology

## 2023-04-02 DIAGNOSIS — N5089 Other specified disorders of the male genital organs: Secondary | ICD-10-CM | POA: Insufficient documentation

## 2023-04-04 ENCOUNTER — Encounter: Payer: Self-pay | Admitting: Urology

## 2023-04-20 ENCOUNTER — Encounter: Payer: No Typology Code available for payment source | Admitting: Internal Medicine

## 2023-05-07 ENCOUNTER — Ambulatory Visit: Payer: No Typology Code available for payment source | Admitting: Urology

## 2023-05-07 ENCOUNTER — Encounter: Payer: Self-pay | Admitting: Urology

## 2023-05-07 VITALS — BP 118/73 | HR 66 | Ht 67.0 in | Wt 156.0 lb

## 2023-05-07 DIAGNOSIS — R972 Elevated prostate specific antigen [PSA]: Secondary | ICD-10-CM

## 2023-05-07 DIAGNOSIS — N419 Inflammatory disease of prostate, unspecified: Secondary | ICD-10-CM | POA: Diagnosis not present

## 2023-05-07 LAB — URINALYSIS, ROUTINE W REFLEX MICROSCOPIC
Bilirubin, UA: NEGATIVE
Glucose, UA: NEGATIVE
Ketones, UA: NEGATIVE
Leukocytes,UA: NEGATIVE
Nitrite, UA: NEGATIVE
Protein,UA: NEGATIVE
RBC, UA: NEGATIVE
Specific Gravity, UA: >1.03 — ABNORMAL HIGH (ref 1.005–1.030)
Urobilinogen, Ur: 0.2 mg/dL (ref 0.2–1.0)
pH, UA: 6 (ref 5.0–7.5)

## 2023-05-07 NOTE — Progress Notes (Signed)
Assessment: 1. Prostatitis, unspecified prostatitis type   2. Rising PSA level   3. Epididymal mass, left     Plan: Repeat PSA in approximately 1 month.  Will contact him with results.  Chief Complaint:  Chief Complaint  Patient presents with   Prostatitis    History of Present Illness:  Casey Curtis is a 47 y.o. male who is seen for continued evaluation of prostatitis, rising PSA, left epididymal mass, and lower urinary tract symptoms.  He was recently found to have a rising PSA value.  No prior history of elevated PSA.  No prior prostate biopsy.  No family history of prostate cancer. PSA results: 7/21 1.33 1/23 1.24 11/24 3.80  He reported a history of a left epididymal cyst.  This was initially diagnosed in 2021 with a scrotal ultrasound done for evaluation of left testicular pain.  The scrotal ultrasound from 11/04/2019 showed normal testes bilaterally and a 10 x 8 x 9 mm heterogeneous mass with a large area of slight hyperechogenicity in the body of the left epididymis with internal blood flow.  He was evaluated by Dr. Marlou Porch at [facility] in July 2021.  Follow-up with repeat scrotal ultrasound was recommended.  The patient reports that he has not had any pain or noticed any change in the size of the epididymal mass until recently.  He has had some intermittent discomfort in the left epididymal area during the past 2-3 weeks.  He has not appreciated a change in the size of the mass.  He also noted some intermittent pain in the rectal area as well as 2-3 episodes of dysuria.  He had some discharge from the urethral meatus.  He reported an episode of pain following ejaculation.  No gross hematuria or flank pain. IPSS = 4.  He was treated with Bactrim x 21 days for prostatitis. Scrotal ultrasound from 04/04/2023 showed bilateral testicular microlithiasis, left epididymal head and body tubular ectasia without obvious epididymal lesion, small bilateral varicoceles.  He  returns today for follow-up.  He has completed the Bactrim.  He has noted improvement in his symptoms with decreased hip pain, decreased rectal pain, and decrease in his urethral discharge.  He did notice an episode of dysuria after intercourse last week.  No gross hematuria or flank pain.   IPSS = 3 today.  Portions of the above documentation were copied from a prior visit for review purposes only.   Past Medical History:  Past Medical History:  Diagnosis Date   Arthritis    Costochondritis    Depression    Gall stones    Pinched nerve    In back   Vasculitis (HCC)     Past Surgical History:  Past Surgical History:  Procedure Laterality Date   KNEE SURGERY      Allergies:  Allergies  Allergen Reactions   Amoxicillin-Pot Clavulanate Nausea And Vomiting and Nausea Only    Family History:  Family History  Problem Relation Age of Onset   Memory loss Father    Colon cancer Maternal Uncle    Arthritis Other    Hyperlipidemia Other    Stroke Other    Hypertension Other    Mental illness Other    Diabetes Other    Colon polyps Neg Hx    Esophageal cancer Neg Hx    Stomach cancer Neg Hx    Rectal cancer Neg Hx     Social History:  Social History   Tobacco Use   Smoking status: Former  Types: Cigarettes   Smokeless tobacco: Never  Vaping Use   Vaping status: Never Used  Substance Use Topics   Alcohol use: Yes   Drug use: Not Currently    Types: Marijuana    ROS: Constitutional:  Negative for fever, chills, weight loss CV: Negative for chest pain, previous MI, hypertension Respiratory:  Negative for shortness of breath, wheezing, sleep apnea, frequent cough GI:  Negative for nausea, vomiting, bloody stool, GERD  Physical exam: BP 118/73   Pulse 66   Ht 5\' 7"  (1.702 m)   Wt 156 lb (70.8 kg)   BMI 24.43 kg/m  GENERAL APPEARANCE:  Well appearing, well developed, well nourished, NAD HEENT:  Atraumatic, normocephalic, oropharynx clear NECK:  Supple  without lymphadenopathy or thyromegaly ABDOMEN:  Soft, non-tender, no masses EXTREMITIES:  Moves all extremities well, without clubbing, cyanosis, or edema NEUROLOGIC:  Alert and oriented x 3, normal gait, CN II-XII grossly intact MENTAL STATUS:  appropriate BACK:  Non-tender to palpation, No CVAT SKIN:  Warm, dry, and intact   Results: U/A: Negative

## 2023-05-18 ENCOUNTER — Ambulatory Visit (AMBULATORY_SURGERY_CENTER): Payer: No Typology Code available for payment source | Admitting: *Deleted

## 2023-05-18 VITALS — Ht 67.0 in | Wt 156.0 lb

## 2023-05-18 DIAGNOSIS — Z1211 Encounter for screening for malignant neoplasm of colon: Secondary | ICD-10-CM

## 2023-05-18 NOTE — Progress Notes (Signed)
 Pt's name and DOB verified at the beginning of the pre-visit wit 2 identifiers  Pt denies any difficulty with ambulating,sitting, laying down or rolling side to side  Pt has no issues with ambulation   Pt has no issues moving head neck or swallowing  No egg or soy allergy known to patient   No issues known to pt with past sedation with any surgeries or procedures  Pt denies having issues being intubated  Patient denies ever being intubated  No FH of Malignant Hyperthermia  Pt is not on diet pills or shots  Pt is not on home 02   Pt is not on blood thinners   Pt denies issues with constipation   Pt is not on dialysis  Pt denise any abnormal heart rhythms   Pt denies any upcoming cardiac testing  Pt encouraged to use to use Singlecare or Goodrx to reduce cost   Patient's chart reviewed by Norleen Schillings CNRA prior to pre-visit and patient appropriate for the [facility].  Pre-visit completed and red dot placed by patient's name on their procedure day (on provider's schedule).  .  Visit by phone  Pt states weight is 156 lb  Instructed pt why it is important to and  to call if they have any changes in health or new medications. Directed them to the # given and on instructions.     Instructions reviewed. Pt given both [facility] main # and MD on call # prior to instructions.  Pt states understanding. Instructed to review again prior to procedure. Pt states they will.   Instructions sent by mail with coupon and by My Chart  Coupon sent via text to mobile phone and pt verified they received it

## 2023-06-04 ENCOUNTER — Other Ambulatory Visit: Payer: No Typology Code available for payment source

## 2023-06-04 DIAGNOSIS — R972 Elevated prostate specific antigen [PSA]: Secondary | ICD-10-CM

## 2023-06-05 ENCOUNTER — Encounter: Payer: Self-pay | Admitting: Urology

## 2023-06-05 LAB — PSA: Prostate Specific Ag, Serum: 1.1 ng/mL (ref 0.0–4.0)

## 2023-06-07 ENCOUNTER — Encounter: Payer: Self-pay | Admitting: Internal Medicine

## 2023-06-12 ENCOUNTER — Other Ambulatory Visit: Payer: Self-pay | Admitting: Rheumatology

## 2023-06-12 DIAGNOSIS — R109 Unspecified abdominal pain: Secondary | ICD-10-CM

## 2023-06-12 DIAGNOSIS — R634 Abnormal weight loss: Secondary | ICD-10-CM

## 2023-06-13 ENCOUNTER — Encounter: Payer: Self-pay | Admitting: Rheumatology

## 2023-06-14 ENCOUNTER — Telehealth: Payer: Self-pay | Admitting: Internal Medicine

## 2023-06-14 ENCOUNTER — Other Ambulatory Visit: Payer: Self-pay | Admitting: Internal Medicine

## 2023-06-14 DIAGNOSIS — Z1211 Encounter for screening for malignant neoplasm of colon: Secondary | ICD-10-CM

## 2023-06-14 MED ORDER — NA SULFATE-K SULFATE-MG SULF 17.5-3.13-1.6 GM/177ML PO SOLN: 1.0000 | Freq: Once | ORAL | 0 refills | Status: AC

## 2023-06-14 NOTE — Telephone Encounter (Signed)
 PT mixed Suprep incorrectly and now needs to new prep along with new instructions. Patient has been advised that he will be contacted once ready for pickup.

## 2023-06-14 NOTE — Telephone Encounter (Signed)
 RTN call, patient states he mixed the suprep incorrectly.  He poured both bottles in the mixing container and filled it up with water.  He is requesting another RX for suprep to be sent to his pharmacy. Suprep instructions reviewed.  Suprep sent via [employer] and GoodRX coupon was sent to patient's mobile number. SChaplin, RN,BSN

## 2023-06-15 ENCOUNTER — Encounter: Payer: Self-pay | Admitting: Internal Medicine

## 2023-06-15 ENCOUNTER — Ambulatory Visit (AMBULATORY_SURGERY_CENTER): Payer: No Typology Code available for payment source | Admitting: Internal Medicine

## 2023-06-15 VITALS — BP 118/80 | HR 58 | Temp 98.2°F | Resp 10 | Ht 67.0 in | Wt 156.0 lb

## 2023-06-15 DIAGNOSIS — Z1211 Encounter for screening for malignant neoplasm of colon: Secondary | ICD-10-CM

## 2023-06-15 DIAGNOSIS — K648 Other hemorrhoids: Secondary | ICD-10-CM

## 2023-06-15 DIAGNOSIS — K5730 Diverticulosis of large intestine without perforation or abscess without bleeding: Secondary | ICD-10-CM

## 2023-06-15 DIAGNOSIS — D124 Benign neoplasm of descending colon: Secondary | ICD-10-CM | POA: Diagnosis not present

## 2023-06-15 MED ORDER — SODIUM CHLORIDE 0.9 % IV SOLN: 500.0000 mL | Freq: Once | INTRAVENOUS | Status: DC

## 2023-06-15 NOTE — Progress Notes (Signed)
 Pt's states no medical or surgical changes since previsit or office visit.

## 2023-06-15 NOTE — Progress Notes (Signed)
 Called to room to assist during endoscopic procedure.  Patient ID and intended procedure confirmed with present staff. Received instructions for my participation in the procedure from the performing physician.

## 2023-06-15 NOTE — Op Note (Signed)
 [facility] Patient Name: Casey Curtis Procedure Date: 06/15/2023 8:42 AM MRN: [id number] Endoscopist: Rosario Estefana Kidney , , [id number] Age: 48 Referring MD:  Date of Birth: Jun 14, 1975 Gender: Male Account #: [id number] Procedure:                Colonoscopy Indications:              Screening for colorectal malignant neoplasm, This                            is the patient's first colonoscopy Medicines:                Monitored Anesthesia Care Procedure:                Pre-Anesthesia Assessment:                           - Prior to the procedure, a History and Physical                            was performed, and patient medications and                            allergies were reviewed. The patient's tolerance of                            previous anesthesia was also reviewed. The risks                            and benefits of the procedure and the sedation                            options and risks were discussed with the patient.                            All questions were answered, and informed consent                            was obtained. Prior Anticoagulants: The patient has                            taken no anticoagulant or antiplatelet agents. ASA                            Grade Assessment: II - A patient with mild systemic                            disease. After reviewing the risks and benefits,                            the patient was deemed in satisfactory condition to                            undergo the procedure.  After obtaining informed consent, the colonoscope                            was passed under direct vision. Throughout the                            procedure, the patient's blood pressure, pulse, and                            oxygen saturations were monitored continuously. The                            Olympus Scope SN [id number] was introduced through the                            anus and advanced to  the the terminal ileum. The                            colonoscopy was performed without difficulty. The                            patient tolerated the procedure well. The quality                            of the bowel preparation was excellent. The                            terminal ileum, ileocecal valve, appendiceal                            orifice, and rectum were photographed. Scope In: 8:47:28 AM Scope Out: 9:06:36 AM Scope Withdrawal Time: 0 hours 15 minutes 12 seconds  Total Procedure Duration: 0 hours 19 minutes 8 seconds  Findings:                 The terminal ileum appeared normal.                           A 6 mm polyp was found in the descending colon. The                            polyp was sessile. The polyp was removed with a                            cold snare. Resection and retrieval were complete.                           Multiple diverticula were found in the sigmoid                            colon and descending colon.                           Non-bleeding internal hemorrhoids were found during  retroflexion. Complications:            No immediate complications. Estimated Blood Loss:     Estimated blood loss was minimal. Impression:               - The examined portion of the ileum was normal.                           - One 6 mm polyp in the descending colon, removed                            with a cold snare. Resected and retrieved.                           - Diverticulosis in the sigmoid colon and in the                            descending colon.                           - Non-bleeding internal hemorrhoids. Recommendation:           - Discharge patient to home (with escort).                           - Await pathology results.                           - Anusol HC cream BID for 7 days. If this is not                            effective, then could consider hemorrhoidal banding.                           - The findings  and recommendations were discussed                            with the patient. Dr Estefana Federico Rosario Estefana Federico,  06/15/2023 9:11:34 AM

## 2023-06-15 NOTE — Progress Notes (Signed)
 A/o x 3, VSS, gd SR's, pleased with anesthesia, report to RN

## 2023-06-15 NOTE — Progress Notes (Signed)
 GASTROENTEROLOGY PROCEDURE H&P NOTE   Primary Care Physician: Regino Slater, MD    Reason for Procedure:   Colon cancer screening  Plan:    Colonoscopy  Patient is appropriate for endoscopic procedure(s) in the ambulatory ([facility]) setting.  The nature of the procedure, as well as the risks, benefits, and alternatives were carefully and thoroughly reviewed with the patient. Ample time for discussion and questions allowed. The patient understood, was satisfied, and agreed to proceed.     HPI: Casey Curtis is a 48 y.o. male who presents for colonoscopy for colon cancer screening. More recently he has seen scant amounts of blood in his stools that are bright red. Denies changes in bowel habits. He has lost about 20 lbs over the last 6 months, not clear whether or not this is intentional. Maternal uncle had colon cancer.   Past Medical History:  Diagnosis Date   Arthritis    Costochondritis    Depression    Diverticulosis    Gall stones    Pinched nerve    In back   Vasculitis [facility])     Past Surgical History:  Procedure Laterality Date   KNEE SURGERY Bilateral    2008 2013    Prior to Admission medications   Medication Sig Start Date End Date Taking? Authorizing Provider  omeprazole (PRILOSEC) 40 MG capsule Take 40 mg by mouth daily. Has not started yet 05/31/23  Yes [provider]  tadalafil  (CIALIS ) 5 MG tablet  02/15/23  Yes [provider]  Multiple Vitamin (MULTIVITAMIN) capsule Take 1 capsule by mouth daily. Patient not taking: Reported on 06/15/2023    [provider]    Current Outpatient Medications  Medication Sig Dispense Refill   omeprazole (PRILOSEC) 40 MG capsule Take 40 mg by mouth daily. Has not started yet     tadalafil  (CIALIS ) 5 MG tablet      Multiple Vitamin (MULTIVITAMIN) capsule Take 1 capsule by mouth daily. (Patient not taking: Reported on 06/15/2023)     Current Facility-Administered Medications  Medication Dose Route  Frequency Provider Last Rate Last Admin   0.9 %  sodium chloride  infusion  500 mL Intravenous Once Federico Rosario BROCKS, MD        Allergies as of 06/15/2023 - Review Complete 06/15/2023  Allergen Reaction Noted   Amoxicillin-pot clavulanate Nausea And Vomiting and Nausea Only 09/21/2018    Family History  Problem Relation Age of Onset   Memory loss Father    Colon cancer Maternal Uncle    Arthritis Other    Hyperlipidemia Other    Stroke Other    Hypertension Other    Mental illness Other    Diabetes Other    Colon polyps Neg Hx    Esophageal cancer Neg Hx    Stomach cancer Neg Hx    Rectal cancer Neg Hx     Social History   Socioeconomic History   Marital status: Married    Spouse name: Not on file   Number of children: Not on file   Years of education: Not on file   Highest education level: Some college, no degree  Occupational History   Not on file  Tobacco Use   Smoking status: Former    Types: Cigarettes   Smokeless tobacco: Never  Vaping Use   Vaping status: Never Used  Substance and Sexual Activity   Alcohol use: Not Currently   Drug use: Not Currently    Types: Marijuana   Sexual activity: Not on  file  Other Topics Concern   Not on file  Social History Narrative   Not on file   Social Drivers of Health   Financial Resource Strain: Low Risk  (03/20/2023)   Overall Financial Resource Strain (CARDIA)    Difficulty of Paying Living Expenses: Not hard at all  Food Insecurity: No Food Insecurity (03/20/2023)   Hunger Vital Sign    Worried About Running Out of Food in the Last Year: Never true    Ran Out of Food in the Last Year: Never true  Transportation Needs: No Transportation Needs (03/20/2023)   PRAPARE - Administrator, Civil Service (Medical): No    Lack of Transportation (Non-Medical): No  Physical Activity: Insufficiently Active (03/20/2023)   Exercise Vital Sign    Days of Exercise per Week: 4 days    Minutes of Exercise per  Session: 30 min  Stress: No Stress Concern Present (03/20/2023)   [employer] of Occupational Health - Occupational Stress Questionnaire    Feeling of Stress : Only a little  Social Connections: Moderately Isolated (03/20/2023)   Social Connection and Isolation Panel [NHANES]    Frequency of Communication with Friends and Family: More than three times a week    Frequency of Social Gatherings with Friends and Family: Once a week    Attends Religious Services: Never    Database Administrator or Organizations: No    Attends Engineer, Structural: 1 to 4 times per year    Marital Status: Separated  Intimate Partner Violence: Not on file    Physical Exam: Vital signs in last 24 hours: BP (!) 142/79   Pulse 74   Temp 98.2 F (36.8 C)   Ht 5' 7 (1.702 m)   Wt 156 lb (70.8 kg)   SpO2 100%   BMI 24.43 kg/m  GEN: NAD EYE: Sclerae anicteric ENT: MMM CV: Non-tachycardic Pulm: No increased work of breathing GI: Soft, NT/ND NEURO:  Alert & Oriented   Estefana Kidney, MD [facility]  06/15/2023 8:30 AM

## 2023-06-15 NOTE — Patient Instructions (Signed)
YOU HAD AN ENDOSCOPIC PROCEDURE TODAY AT THE [facility]:   Refer to the procedure report that was given to you for any specific questions about what was found during the examination.  If the procedure report does not answer your questions, please call your gastroenterologist to clarify.  If you requested that your care partner not be given the details of your procedure findings, then the procedure report has been included in a sealed envelope for you to review at your convenience later.  **Handouts given on polyps, hemorrhoids and diverticulosis**  YOU SHOULD EXPECT: Some feelings of bloating in the abdomen. Passage of more gas than usual.  Walking can help get rid of the air that was put into your GI tract during the procedure and reduce the bloating. If you had a lower endoscopy (such as a colonoscopy or flexible sigmoidoscopy) you may notice spotting of blood in your stool or on the toilet paper. If you underwent a bowel prep for your procedure, you may not have a normal bowel movement for a few days.  Please Note:  You might notice some irritation and congestion in your nose or some drainage.  This is from the oxygen used during your procedure.  There is no need for concern and it should clear up in a day or so.  SYMPTOMS TO REPORT IMMEDIATELY:  Following lower endoscopy (colonoscopy or flexible sigmoidoscopy):  Excessive amounts of blood in the stool  Significant tenderness or worsening of abdominal pains  Swelling of the abdomen that is new, acute  Fever of 100F or higher  For urgent or emergent issues, a gastroenterologist can be reached at any hour by calling [phone]. Do not use MyChart messaging for urgent concerns.    DIET:  We do recommend a small meal at first, but then you may proceed to your regular diet.  Drink plenty of fluids but you should avoid alcoholic beverages for 24 hours.  ACTIVITY:  You should plan to take it easy for the rest of today and you  should NOT DRIVE or use heavy machinery until tomorrow (because of the sedation medicines used during the test).    FOLLOW UP: Our staff will call the number listed on your records the next business day following your procedure.  We will call around 7:15- 8:00 am to check on you and address any questions or concerns that you may have regarding the information given to you following your procedure. If we do not reach you, we will leave a message.     If any biopsies were taken you will be contacted by phone or by letter within the next 1-3 weeks.  Please call us at [phone] if you have not heard about the biopsies in 3 weeks.    SIGNATURES/CONFIDENTIALITY: You and/or your care partner have signed paperwork which will be entered into your electronic medical record.  These signatures attest to the fact that that the information above on your After Visit Summary has been reviewed and is understood.  Full responsibility of the confidentiality of this discharge information lies with you and/or your care-partner. 

## 2023-06-18 ENCOUNTER — Telehealth: Payer: Self-pay | Admitting: *Deleted

## 2023-06-18 NOTE — Telephone Encounter (Signed)
  Follow up Call-     06/15/2023    7:38 AM  Call back number  Post procedure Call Back phone  # [phone]  Permission to leave phone message Yes   [facility]

## 2023-06-19 ENCOUNTER — Encounter: Payer: Self-pay | Admitting: Internal Medicine

## 2023-06-19 ENCOUNTER — Other Ambulatory Visit: Payer: No Typology Code available for payment source

## 2023-06-19 LAB — SURGICAL PATHOLOGY

## 2023-06-20 ENCOUNTER — Ambulatory Visit
Admission: RE | Admit: 2023-06-20 | Discharge: 2023-06-20 | Disposition: A | Discharge status: Home / Self Care | Payer: No Typology Code available for payment source | Source: Ambulatory Visit | Attending: Rheumatology | Admitting: Rheumatology

## 2023-06-20 ENCOUNTER — Encounter: Payer: Self-pay | Admitting: Internal Medicine

## 2023-06-20 DIAGNOSIS — R634 Abnormal weight loss: Secondary | ICD-10-CM

## 2023-06-20 DIAGNOSIS — R109 Unspecified abdominal pain: Secondary | ICD-10-CM

## 2023-06-20 MED ORDER — IOPAMIDOL (ISOVUE-370) INJECTION 76%
100.0000 mL | Freq: Once | INTRAVENOUS | Status: AC | PRN
  Administered 2023-06-20: 100 mL via INTRAVENOUS

## 2023-09-03 ENCOUNTER — Ambulatory Visit: Admitting: Neurology

## 2023-09-03 ENCOUNTER — Encounter: Payer: Self-pay | Admitting: Neurology

## 2023-09-03 ENCOUNTER — Telehealth: Payer: Self-pay | Admitting: Neurology

## 2023-09-03 VITALS — BP 132/82 | HR 71 | Ht 67.0 in | Wt 163.0 lb

## 2023-09-03 DIAGNOSIS — R202 Paresthesia of skin: Secondary | ICD-10-CM | POA: Insufficient documentation

## 2023-09-03 DIAGNOSIS — M549 Dorsalgia, unspecified: Secondary | ICD-10-CM | POA: Diagnosis not present

## 2023-09-03 NOTE — Telephone Encounter (Signed)
 sent to GI they obtain Casey Curtis [phone]

## 2023-09-03 NOTE — Progress Notes (Signed)
 Chief Complaint  Patient presents with   New Patient (Initial Visit)    Rm15, alone,  NP/Paper/[facility]/[place] Aryal MD [phone] bilateral hand tingling/numbness:also is in back and spine and radiates through left hip and abdomen ongoing since December 2022. Tender spot on left spine in middle       ASSESSMENT AND PLAN  Casey Curtis is a 48 y.o. male   Chronic upper back pain  Brisk reflex on examination, reported history of transient urinary urgency frequency,  MRI of thoracic spine Intermittent left upper extremity paresthesia  EMG nerve conduction study to rule out left upper extremity focal neuropathy  DIAGNOSTIC DATA (LABS, IMAGING, TESTING) - I reviewed patient records, labs, notes, testing and imaging myself where available.   MEDICAL HISTORY:  Casey Curtis, is a 48 year old right-handed male seen in request by [facility] Dr.   Bascom Lily, [place], for evaluation of left upper extremity paresthesia  History is obtained from the patient and review of electronic medical records. I personally reviewed pertinent available imaging films in PACS.   PMHx of  Cutaneous polyarteritis nodosa per biopsy by [facility] dermatologist in January 2024  He had a history of cutaneous polyarteritis nodosa that was diagnosed in January 2024 following a skin biopsy by dermatologist at [facility], he presented with dark brown lesion at his lower extremities  He was also seen by [facility] rheumatologist Dr.Aryal in February 2025, to rule out systemic polyarteritis nodosa, had extensive evaluation including CT angiogram of abdomen aorta and branches in February 2025 showed no evidence of aneurysm, dissection or focal arterial abnormality, normal anatomic anomaly, evidence of cholelithiasis, small benign capillary hemangioma in the posterior medial aspect of the segment 6 of the liver, mild colonic diverticulosis  Urinalysis in February 2025 was normal  He began to have  pain at the left lower abdomen pelvic region, CT abdomen January 2023 showed diverticulosis but no other pathology  MRI of the pelvic without contrast unremarkable  He denies new skin lesions, feels a knot in his calf but not always, s he has libido reticularis type rash brown discolored patch in his left and some isolated brown spots, denies joint pain, tingling in his left hands, and goes, laboratory from dermatology January 2024, negative ANA, ANCA, SAB, HIV, antiphospholipid antibody, protein electrophoresis, normal liver functional test, hemoglobin of 14.4    He complains of some chest pain, consider due to costochondritis  He works a Health and safety inspector job, since 2023 complains of intermittent numbness of left arm, sometimes woke him up from sleep, but denied persistent motor or sensory deficit  He also complains of transient history of urinary urgency, frequency, but is normal now, but complains of intermittent upper thoracic tender spot, triggered by lifting, vacuuming," as if needle stuck there"  PHYSICAL EXAM:   Vitals:   09/03/23 0830  BP: 132/82  Pulse: 71  Weight: 163 lb (73.9 kg)  Height: 5\' 7"  (1.702 m)   Not recorded     Body mass index is 25.53 kg/m.  PHYSICAL EXAMNIATION:  Gen: NAD, conversant, well nourised, well groomed                     Cardiovascular: Regular rate rhythm, no peripheral edema, warm, nontender. Eyes: Conjunctivae clear without exudates or hemorrhage Neck: Supple, no carotid bruits. Pulmonary: Clear to auscultation bilaterally   NEUROLOGICAL EXAM:  MENTAL STATUS: Speech/cognition: Awake, alert, oriented to history taking and casual conversation CRANIAL NERVES: CN II: Visual fields are full to confrontation.  Pupils are round equal and briskly reactive to light. CN III, IV, VI: extraocular movement are normal. No ptosis. CN V: Facial sensation is intact to light touch CN VII: Face is symmetric with normal eye closure  CN VIII: Hearing is normal to  causal conversation. CN IX, X: Phonation is normal. CN XI: Head turning and shoulder shrug are intact  MOTOR: There is no pronator drift of out-stretched arms. Muscle bulk and tone are normal. Muscle strength is normal.  REFLEXES: Reflexes are 2+ and symmetric at the biceps, triceps, knees, and ankles. Plantar responses are flexor.  SENSORY: Intact to light touch, pinprick and vibratory sensation are intact in fingers and toes.  COORDINATION: There is no trunk or limb dysmetria noted.  GAIT/STANCE: Posture is normal. Gait is steady with normal steps, base, arm swing, and turning. Heel and toe walking are normal. Tandem gait is normal.  Romberg is absent.  REVIEW OF SYSTEMS:  Full 14 system review of systems performed and notable only for as above All other review of systems were negative.   ALLERGIES: Allergies  Allergen Reactions   Amoxicillin-Pot Clavulanate Nausea And Vomiting and Nausea Only    HOME MEDICATIONS: Current Outpatient Medications  Medication Sig Dispense Refill   Multiple Vitamin (MULTIVITAMIN) capsule Take 1 capsule by mouth daily.     tadalafil  (CIALIS ) 5 MG tablet      No current facility-administered medications for this visit.    PAST MEDICAL HISTORY: Past Medical History:  Diagnosis Date   Arthritis    Costochondritis    Depression    Diverticulosis    Gall stones    Pinched nerve    In back   Vasculitis (HCC)     PAST SURGICAL HISTORY: Past Surgical History:  Procedure Laterality Date   KNEE SURGERY Bilateral    2008 2013    FAMILY HISTORY: Family History  Problem Relation Age of Onset   Memory loss Father    Colon cancer Maternal Uncle    Arthritis Other    Hyperlipidemia Other    Stroke Other    Hypertension Other    Mental illness Other    Diabetes Other    Colon polyps Neg Hx    Esophageal cancer Neg Hx    Stomach cancer Neg Hx    Rectal cancer Neg Hx     SOCIAL HISTORY: Social History   Socioeconomic History    Marital status: Married    Spouse name: Not on file   Number of children: Not on file   Years of education: Not on file   Highest education level: Some college, no degree  Occupational History   Not on file  Tobacco Use   Smoking status: Former    Types: Cigarettes   Smokeless tobacco: Never  Vaping Use   Vaping status: Never Used  Substance and Sexual Activity   Alcohol use: Not Currently   Drug use: Not Currently    Types: Marijuana   Sexual activity: Not on file  Other Topics Concern   Not on file  Social History Narrative   Not on file   Social Drivers of Health   Financial Resource Strain: Low Risk  (03/20/2023)   Overall Financial Resource Strain (CARDIA)    Difficulty of Paying Living Expenses: Not hard at all  Food Insecurity: No Food Insecurity (03/20/2023)   Hunger Vital Sign    Worried About Running Out of Food in the Last Year: Never true    Ran Out of Food in  the Last Year: Never true  Transportation Needs: No Transportation Needs (03/20/2023)   PRAPARE - Administrator, Civil Service (Medical): No    Lack of Transportation (Non-Medical): No  Physical Activity: Insufficiently Active (03/20/2023)   Exercise Vital Sign    Days of Exercise per Week: 4 days    Minutes of Exercise per Session: 30 min  Stress: No Stress Concern Present (03/20/2023)   [employer] of Occupational Health - Occupational Stress Questionnaire    Feeling of Stress : Only a little  Social Connections: Socially Isolated (03/20/2023)   Social Connection and Isolation Panel [NHANES]    Frequency of Communication with Friends and Family: More than three times a week    Frequency of Social Gatherings with Friends and Family: Once a week    Attends Religious Services: Never    Database administrator or Organizations: No    Attends Engineer, structural: Not on file    Marital Status: Separated  Intimate Partner Violence: Not on file      Phebe Brasil, M.D.  Ph.D.  [facility] [address], [address] [place], [place] [address] Ph: [phone] Fax: [phone]  CC:  Larrie Po, MD [address] [place],  [place] [address]  Lanae Pinal, MD

## 2023-09-04 ENCOUNTER — Encounter: Payer: Self-pay | Admitting: Neurology

## 2023-09-14 ENCOUNTER — Ambulatory Visit: Payer: No Typology Code available for payment source | Admitting: Diagnostic Neuroimaging

## 2023-09-18 ENCOUNTER — Inpatient Hospital Stay: Admission: RE | Admit: 2023-09-18 | Source: Ambulatory Visit

## 2023-10-04 ENCOUNTER — Ambulatory Visit
Admission: RE | Admit: 2023-10-04 | Discharge: 2023-10-04 | Disposition: A | Discharge status: Home / Self Care | Source: Ambulatory Visit | Attending: Neurology | Admitting: Neurology

## 2023-10-04 DIAGNOSIS — R202 Paresthesia of skin: Secondary | ICD-10-CM | POA: Diagnosis not present

## 2023-10-04 MED ORDER — GADOPICLENOL 0.5 MMOL/ML IV SOLN
7.0000 mL | Freq: Once | INTRAVENOUS | Status: AC | PRN
Start: 2023-10-04 — End: 2023-10-04
  Administered 2023-10-04: 7 mL via INTRAVENOUS

## 2023-10-08 ENCOUNTER — Ambulatory Visit: Payer: Self-pay | Admitting: Neurology

## 2023-10-12 ENCOUNTER — Ambulatory Visit: Admitting: Neurology

## 2023-10-12 ENCOUNTER — Encounter: Payer: Self-pay | Admitting: Neurology

## 2023-10-12 DIAGNOSIS — R202 Paresthesia of skin: Secondary | ICD-10-CM | POA: Diagnosis not present

## 2023-10-12 NOTE — Procedures (Signed)
 Full Name: Casey Curtis Gender: Male MRN #: [id number] Date of Birth: 05-23-1975    Visit Date: 10/12/2023 08:49 Age: 48 Years Examining Physician: Phebe Brasil Referring Physician: Gracie Lav Height: 5 feet 7 inch History: 48 year old male complains of left neck pain, radiating pain to left shoulder, intermittent right wrist discomfort  Summary of the test:  Nerve conduction study:  Bilateral median, ulnar motor responses were normal.  Bilateral ulnar sensory responses were normal.  Bilateral median sensory responses showed borderline prolonged peak latency with well-preserved snap amplitude.  Right right median mixed response was 0.5 ms prolonged compared to ipsilateral ulnar mixed response. Left median and ulnar mixed responses were within normal limit  Electromyography: Selected needle examination of bilateral upper extremity muscles and cervical paraspinal muscles were normal  Conclusion: This is a mild abnormal study.  There is electrodiagnostic evidence of mild right distal median neuropathy across the wrist, consistent with mild carpal tunnel syndrome, there is no evidence of left cervical radiculopathy.    ------------------------------- Phebe Brasil. M.D. Ph.D.   [facility] [address] [place], [place] [address] Tel: [phone] Fax: [phone]  Verbal informed consent was obtained from the patient, patient was informed of potential risk of procedure, including bruising, bleeding, hematoma formation, infection, muscle weakness, muscle pain, numbness, among others.        MNC    Nerve / Sites Muscle Latency Ref. Amplitude Ref. Rel Amp Segments Distance Velocity Ref. Area    ms ms mV mV %  cm m/s m/s mVms  L Median - APB     Wrist APB 3.8 <=4.4 9.7 >=4.0 100 Wrist - APB 7   40.0     Upper arm APB 7.7  8.9  91.2 Upper arm - Wrist 22 56 >=49 36.9  R Median - APB     Wrist APB 3.6 <=4.4 8.4 >=4.0 100 Wrist - APB 7   32.8     Upper  arm APB 7.9  7.9  94.4 Upper arm - Wrist 23 54 >=49 32.5  L Ulnar - ADM     Wrist ADM 3.0 <=3.3 8.8 >=6.0 100 Wrist - ADM 7   35.0     B.Elbow ADM 5.8  8.6  97.6 B.Elbow - Wrist 14 50 >=49 31.9     A.Elbow ADM 9.0  8.2  96.1 A.Elbow - B.Elbow 13 42 >=49 31.4  R Ulnar - ADM     Wrist ADM 3.0 <=3.3 9.5 >=6.0 100 Wrist - ADM 7   36.4     B.Elbow ADM 5.5  9.5  99.4 B.Elbow - Wrist 16 63 >=49 38.3     A.Elbow ADM 8.3  9.0  94.7 A.Elbow - B.Elbow 15 54 >=49 37.1             SNC    Nerve / Sites Rec. Site Peak Lat Ref.  Amp Ref. Segments Distance Peak Diff Ref.    ms ms V V  cm ms ms  R Median, Ulnar - Transcarpal comparison     Median Palm Wrist 2.3 <=2.2 45 >=35 Median Palm - Wrist 8       Ulnar Palm Wrist 1.8 <=2.2 22 >=12 Ulnar Palm - Wrist 8          Median Palm - Ulnar Palm  0.5 <=0.4  L Median, Ulnar - Transcarpal comparison     Median Palm Wrist 2.3 <=2.2 44 >=35 Median Palm - Wrist 8  Ulnar Palm Wrist 2.3 <=2.2 12 >=12 Ulnar Palm - Wrist 8          Median Palm - Ulnar Palm  0.1 <=0.4  R Median - Orthodromic (Dig II, Mid palm)     Dig II Wrist 3.3 <=3.4 15 >=10 Dig II - Wrist 13    L Median - Orthodromic (Dig II, Mid palm)     Dig II Wrist 3.4 <=3.4 20 >=10 Dig II - Wrist 13    R Ulnar - Orthodromic, (Dig V, Mid palm)     Dig V Wrist 2.9 <=3.1 10 >=5 Dig V - Wrist 11    L Ulnar - Orthodromic, (Dig V, Mid palm)     Dig V Wrist 3.0 <=3.1 6 >=5 Dig V - Wrist 37                   F  Wave    Nerve F Lat Ref.   ms ms  L Ulnar - ADM 28.8 <=32.0  R Ulnar - ADM 28.3 <=32.0         EMG Summary Table    Spontaneous MUAP Recruitment  Muscle IA Fib PSW Fasc Other Amp Dur. Poly Pattern  L. First dorsal interosseous Normal None None None _______ Normal Normal Normal Normal  L. Pronator teres Normal None None None _______ Normal Normal Normal Normal  L. Biceps brachii Normal None None None _______ Normal Normal Normal Normal  L. Deltoid Normal None None None _______ Normal Normal  Normal Normal  L. Triceps brachii Normal None None None _______ Normal Normal Normal Normal  L. Extensor digitorum communis Normal None None None _______ Normal Normal Normal Normal  L. Cervical paraspinals Normal None None None _______ Normal Normal Normal Normal  R. First dorsal interosseous Normal None None None _______ Normal Normal Normal Normal  R. Pronator teres Normal None None None _______ Normal Normal Normal Normal  R. Biceps brachii Normal None None None _______ Normal Normal Normal Normal  R. Deltoid Normal None None None _______ Normal Normal Normal Normal  R. Triceps brachii Normal None None None _______ Normal Normal Normal Normal  R. Extensor digitorum communis Normal None None None _______ Normal Normal Normal Normal

## 2024-03-20 NOTE — Progress Notes (Unsigned)
 03/21/2024 Casey Curtis [id number] 1975/08/06  Referring provider: Regino Slater, MD Primary GI doctor: Dr. Federico  ASSESSMENT AND PLAN:  GERD x Oct 2024 wit dysphagia, regurgitation, continued issue with GERD despite PPI use but dysphagia is better Worsening since daily dose of cialsis Negative for atopy, no NSAIDS, ETOH on weekends with 5th of liquer, no tobacco use, no drug use (one week) 06/20/2023 CT angio abdomen pelvis variant anatomy with common hepatic artery placed to SMA nones of aneurysm dissection or arterial abnormality.  Cholelithiasis small benign capillary hemangioma, mild colonic diverticulosis - continue omeprazole 40 mg -Lifestyle changes discussed, avoid ETOH, hand out given to the patient -Schedule EGD at [facility] to evaluate GERD, esophagitis, hiatal hernia,H pylori. I discussed risks of EGD with patient today, including risk of sedation, bleeding or perforation. Patient provides understanding and gave verbal consent to proceed. - consider RUQ US  and HIDA pending results  Personal history of colon polyps 06/15/2023 colonoscopy excellent prep 6 mm TA polyp diverticula nonbleeding internal hemorrhoids Recall 7 years  polyarteritis nodosa  X 2022 06/20/2023 CT angio abdomen pelvis variant anatomy with common hepatic artery placed to SMA nones of aneurysm dissection or arterial abnormality.  Cholelithiasis small benign capillary hemangioma, mild colonic diverticulosis  Cholelithiasis seen on imaging without inflammation Patient has no symptoms at this time, labs are normal.  -We went over symptoms of cholecystitis, cholangitis and pancreatitis that would prompt him to return to the ER if they happen.  -Advised low fat diet.  -Agrees to monitor at his time.   Diverticulosis Will call if any symptoms. Add on fiber supplement, avoid NSAIDS, information given   Patient Care Team: Regino Slater, MD as PCP - General (Family Medicine)  HISTORY OF PRESENT  ILLNESS: 48 y.o. male with a past medical history listed below presents for evaluation of GERD/dysphagia.   Discussed the use of AI scribe software for clinical note transcription with the patient, who gave verbal consent to proceed.  History of Present Illness   Casey Curtis is a 48 year old male who presents with reflux and heartburn.  He has experienced reflux and heartburn since October of last year, which he associates with starting a daily 5 mg dose of Cialis . Initially, he managed the symptoms with daily Tums until January, when he was prescribed omeprazole. He delayed starting omeprazole until August, after which his symptoms improved significantly.  On October 30th, he experienced an episode of vomiting after consuming alcohol and fish, which led to hematemesis. Similar episodes occurred twice before starting omeprazole, with food feeling stuck and vomiting blood mixed with food.  He has a history of polyarthritis nodosa diagnosed in July 2022, but reports no flare-ups or recurrences since then. He underwent a colonoscopy on February 7th of this year, which revealed a 6 mm tubular adenomatous polyp, diverticula, and internal hemorrhoids. A CT angiography of the abdomen and pelvis showed no aneurysms or arterial abnormalities but did reveal gallstones.  He mentions a family history of gallbladder issues, with an uncle who had his gallbladder removed. He consumes alcohol weekly, typically a fifth of a bottle on weekends, and has recently stopped smoking THC. No use of Aleve, ibuprofen, or Goody Powders, and no history of asthma, allergies, or eczema.        He  reports that he has quit smoking. His smoking use included cigarettes. He has never used smokeless tobacco. He reports that he does not currently use alcohol. He reports that he does not currently use  drugs after having used the following drugs: Marijuana.  RELEVANT GI HISTORY, IMAGING AND LABS: Results   RADIOLOGY CT Angiography  Abdomen and Pelvis: No aneurysms, dissection, or arterial abnormalities; diverticulosis; gallstones; normal ducts  DIAGNOSTIC Colonoscopy: 6 mm tubular adenomatous polyp, diverticula, internal hemorrhoids (06/15/2023)      CBC    Component Value Date/Time   WBC 5.4 03/20/2023 1546   RBC 4.37 03/20/2023 1546   HGB 14.2 03/20/2023 1546   HCT 43.1 03/20/2023 1546   PLT 236.0 03/20/2023 1546   MCV 98.7 03/20/2023 1546   MCH 32.4 04/15/2021 1419   MCHC 33.1 03/20/2023 1546   RDW 13.1 03/20/2023 1546   LYMPHSABS 1.6 03/20/2023 1546   MONOABS 0.6 03/20/2023 1546   EOSABS 0.1 03/20/2023 1546   BASOSABS 0.1 03/20/2023 1546   No results for input(s): HGB in the last 8760 hours.  CMP     Component Value Date/Time   NA 140 03/20/2023 1546   K 4.2 03/20/2023 1546   CL 102 03/20/2023 1546   CO2 29 03/20/2023 1546   GLUCOSE 90 03/20/2023 1546   BUN 9 03/20/2023 1546   CREATININE 1.01 03/20/2023 1546   CREATININE 1.11 04/15/2021 1419   CALCIUM 9.2 03/20/2023 1546   PROT 6.9 03/20/2023 1546   ALBUMIN 4.4 03/20/2023 1546   AST 17 03/20/2023 1546   ALT 19 03/20/2023 1546   ALKPHOS 54 03/20/2023 1546   BILITOT 0.5 03/20/2023 1546   GFRNONAA 100.31 12/28/2008 0826      Latest Ref Rng & Units 03/20/2023    3:46 PM 07/19/2022    9:51 AM 04/15/2021    2:19 PM  Hepatic Function  Total Protein 6.0 - 8.3 g/dL 6.9  6.5  7.0   Albumin 3.5 - 5.2 g/dL 4.4  4.1    AST 0 - 37 U/L 17  16  23    ALT 0 - 53 U/L 19  26  39   Alk Phosphatase 39 - 117 U/L 54  49    Total Bilirubin 0.2 - 1.2 mg/dL 0.5  0.5  0.6       Current Medications:      Current Outpatient Medications (Analgesics):    diclofenac  (VOLTAREN ) 75 MG EC tablet, Take 1 tablet twice a day by oral route.   Current Outpatient Medications (Other):    omeprazole (PRILOSEC) 40 MG capsule, Take 40 mg by mouth every morning.   psyllium (METAMUCIL) 58.6 % powder, Take 1 packet by mouth 3 (three) times daily.  Medical History:   Past Medical History:  Diagnosis Date   Arthritis    Costochondritis    Depression    Diverticulosis    Gall stones    Pinched nerve    In back   Vasculitis    Allergies:  Allergies  Allergen Reactions   Amoxicillin-Pot Clavulanate Nausea And Vomiting and Nausea Only     Surgical History:  He  has a past surgical history that includes Knee surgery (Bilateral). Family History:  His family history includes Arthritis in an other family member; Colon cancer in his maternal uncle; Diabetes in an other family member; Hyperlipidemia in an other family member; Hypertension in an other family member; Memory loss in his father; Mental illness in an other family member; Stroke in an other family member.  REVIEW OF SYSTEMS  : All other systems reviewed and negative except where noted in the History of Present Illness.  PHYSICAL EXAM: BP 126/82   Pulse 67   Ht 5'  7 (1.702 m)   Wt 172 lb 4 oz (78.1 kg)   SpO2 97%   BMI 26.98 kg/m  Physical Exam   GENERAL APPEARANCE: Well nourished, in no apparent distress. HEENT: No cervical lymphadenopathy, unremarkable thyroid, sclerae anicteric, conjunctiva pink. RESPIRATORY: Respiratory effort normal, breath sounds equal bilaterally without rales, rhonchi, or wheezing. CARDIO: Regular rate and rhythm with no murmurs, rubs, or gallops, peripheral pulses intact. ABDOMEN: Soft, non-distended, active bowel sounds in all four quadrants, no tenderness to palpation, no rebound, no mass appreciated, negative Murphy's sign and Quincetta's sign. RECTAL: Declines. MUSCULOSKELETAL: Full range of motion, normal gait, without edema. SKIN: Dry, intact without rashes or lesions. No jaundice. NEURO: Alert, oriented, no focal deficits. PSYCH: Cooperative, normal mood and affect.      Alan JONELLE Coombs, PA-C 11:52 AM

## 2024-03-21 ENCOUNTER — Encounter: Payer: Self-pay | Admitting: Physician Assistant

## 2024-03-21 ENCOUNTER — Ambulatory Visit: Admitting: Physician Assistant

## 2024-03-21 VITALS — BP 126/82 | HR 67 | Ht 67.0 in | Wt 172.2 lb

## 2024-03-21 DIAGNOSIS — R1319 Other dysphagia: Secondary | ICD-10-CM

## 2024-03-21 DIAGNOSIS — Z860101 Personal history of adenomatous and serrated colon polyps: Secondary | ICD-10-CM | POA: Diagnosis not present

## 2024-03-21 DIAGNOSIS — K219 Gastro-esophageal reflux disease without esophagitis: Secondary | ICD-10-CM | POA: Diagnosis not present

## 2024-03-21 NOTE — Patient Instructions (Addendum)
 You have been scheduled for an endoscopy. Please follow written instructions given to you at your visit today.  If you use inhalers (even only as needed), please bring them with you on the day of your procedure.  If you take any of the following medications, they will need to be adjusted prior to your procedure:   DO NOT TAKE 7 DAYS PRIOR TO TEST- Trulicity (dulaglutide) Ozempic, Wegovy (semaglutide) Mounjaro, Zepbound (tirzepatide) Bydureon Bcise (exanatide extended release)  DO NOT TAKE 1 DAY PRIOR TO YOUR TEST Rybelsus (semaglutide) Adlyxin (lixisenatide) Victoza (liraglutide) Byetta (exanatide) ___________________________________________________________________________    Please take your proton pump inhibitor medication, omeprazole 40 mg   Please take this medication 30 minutes to 1 hour before meals- this makes it more effective.  Avoid spicy and acidic foods Avoid fatty foods Limit your intake of coffee, tea, alcohol, and carbonated drinks Work to maintain a healthy weight Keep the head of the bed elevated at least 3 inches with blocks or a wedge pillow if you are having any nighttime symptoms Stay upright for 2 hours after eating Avoid meals and snacks three to four hours before bedtime  Diverticulosis Diverticulosis is a condition that develops when small pouches (diverticula) form in the wall of the large intestine (colon). The colon is where water is absorbed and stool (feces) is formed. The pouches form when the inside layer of the colon pushes through weak spots in the outer layers of the colon. You may have a few pouches or many of them. The pouches usually do not cause problems unless they become inflamed or infected. When this happens, the condition is called diverticulitis- this is left lower quadrant pain, diarrhea, fever, chills, nausea or vomiting.  If this occurs please call the office or go to the hospital. Sometimes these patches without inflammation can also  have painless bleeding associated with them, if this happens please call the office or go to the hospital. Preventing constipation and increasing fiber can help reduce diverticula and prevent complications. Even if you feel you have a high-fiber diet, suggest getting on Benefiber or Cirtracel 2 times daily.
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
## 2024-03-24 ENCOUNTER — Encounter: Payer: Self-pay | Admitting: Pediatrics

## 2024-03-24 ENCOUNTER — Ambulatory Visit: Admitting: Pediatrics

## 2024-03-24 VITALS — BP 122/83 | HR 62 | Temp 97.3°F | Resp 14 | Ht 67.0 in | Wt 172.0 lb

## 2024-03-24 DIAGNOSIS — R112 Nausea with vomiting, unspecified: Secondary | ICD-10-CM

## 2024-03-24 DIAGNOSIS — K2950 Unspecified chronic gastritis without bleeding: Secondary | ICD-10-CM | POA: Diagnosis not present

## 2024-03-24 DIAGNOSIS — R1110 Vomiting, unspecified: Secondary | ICD-10-CM

## 2024-03-24 DIAGNOSIS — R12 Heartburn: Secondary | ICD-10-CM

## 2024-03-24 DIAGNOSIS — K449 Diaphragmatic hernia without obstruction or gangrene: Secondary | ICD-10-CM

## 2024-03-24 DIAGNOSIS — K920 Hematemesis: Secondary | ICD-10-CM

## 2024-03-24 DIAGNOSIS — K219 Gastro-esophageal reflux disease without esophagitis: Secondary | ICD-10-CM

## 2024-03-24 DIAGNOSIS — R1319 Other dysphagia: Secondary | ICD-10-CM

## 2024-03-24 MED ORDER — SODIUM CHLORIDE 0.9 % IV SOLN: 500.0000 mL | INTRAVENOUS | Status: DC

## 2024-03-24 NOTE — Op Note (Addendum)
 [facility] Patient Name: Casey Curtis Procedure Date: 03/24/2024 10:07 AM MRN: [id number] Endoscopist: Inocente Hausen , MD, [id number] Age: 48 Referring MD:  Date of Birth: 04-17-1976 Gender: Male Account #: [id number] Procedure:                Upper GI endoscopy Indications:              Follow-up of gastro-esophageal reflux disease,                            Hematemesis, Vomiting, Regurgitation Medicines:                Monitored Anesthesia Care Procedure:                Pre-Anesthesia Assessment:                           - Prior to the procedure, a History and Physical                            was performed, and patient medications and                            allergies were reviewed. The patient's tolerance of                            previous anesthesia was also reviewed. The risks                            and benefits of the procedure and the sedation                            options and risks were discussed with the patient.                            All questions were answered, and informed consent                            was obtained. Prior Anticoagulants: The patient has                            taken no anticoagulant or antiplatelet agents. ASA                            Grade Assessment: I - A normal, healthy patient.                            After reviewing the risks and benefits, the patient                            was deemed in satisfactory condition to undergo the                            procedure.  After obtaining informed consent, the endoscope was                            passed under direct vision. Throughout the                            procedure, the patient's blood pressure, pulse, and                            oxygen saturations were monitored continuously. The                            Olympus scope [id number] was introduced through the                            mouth, and advanced to the second  part of duodenum.                            The upper GI endoscopy was accomplished without                            difficulty. The patient tolerated the procedure                            well. Scope In: Scope Out: Findings:                 The upper third of the esophagus and middle third                            of the esophagus were normal.                           A few tongues of salmon-colored mucosa and a                            mucosal island were present. The maximum                            longitudinal extent of these esophageal mucosal                            changes was 1 cm in length. Biopsies were taken                            with a cold forceps for histology to evaluate for                            Barrett's esophagus.                           Biopsies were obtained from the proximal and distal                            esophagus with cold  forceps for histology for                            evaluation of eosinophilic esophagitis.                           The gastric body, gastric antrum, cardia (on                            retroflexion) and gastric fundus (on retroflexion)                            were normal. Biopsies were taken with a cold                            forceps for Helicobacter pylori testing.                           A small hiatal hernia was present.                           The first portion of the duodenum and second                            portion of the duodenum were normal. Complications:            No immediate complications. Estimated blood loss:                            Minimal. Estimated Blood Loss:     Estimated blood loss was minimal. Impression:               - Normal upper third of esophagus and middle third                            of esophagus.                           - Salmon-colored mucosa suspicious for                            short-segment Barrett's esophagus. Biopsied.                            - Normal gastric body, antrum, cardia and gastric                            fundus. Biopsied.                           - Small hiatal hernia.                           - Normal first portion of the duodenum and second                            portion of the duodenum.                           -  Biopsies were taken with a cold forceps for                            evaluation of eosinophilic esophagitis. Recommendation:           - Discharge patient to home (ambulatory).                           - Await pathology results.                           - The findings and recommendations were discussed                            with the patient's family.                           - Return to GI clinic in 6-8 weeks with Dr. Suzann                            or APP Oscar Coombs, [place]).                           - Patient has a contact number available for                            emergencies. The signs and symptoms of potential                            delayed complications were discussed with the                            patient. Return to normal activities tomorrow.                            Written discharge instructions were provided to the                            patient. Inocente Suzann, MD 03/24/2024 10:39:20 AM This report has been signed electronically.

## 2024-03-24 NOTE — Progress Notes (Signed)
 Transferred to PACU via stretcher.  Not responding to stimulation at this time.  VSS upon leaving procedure room.

## 2024-03-24 NOTE — Progress Notes (Signed)
 Called to room to assist during endoscopic procedure.  Patient ID and intended procedure confirmed with present staff. Received instructions for my participation in the procedure from the performing physician.

## 2024-03-24 NOTE — Progress Notes (Signed)
 Pt's states no medical or surgical changes since previsit or office visit.

## 2024-03-24 NOTE — Patient Instructions (Signed)
 YOU HAD AN ENDOSCOPIC PROCEDURE TODAY AT THE [facility]:   Refer to the procedure report that was given to you for any specific questions about what was found during the examination.  If the procedure report does not answer your questions, please call your gastroenterologist to clarify.  If you requested that your care partner not be given the details of your procedure findings, then the procedure report has been included in a sealed envelope for you to review at your convenience later.  YOU SHOULD EXPECT: Some feelings of bloating in the abdomen. Passage of more gas than usual.  Walking can help get rid of the air that was put into your GI tract during the procedure and reduce the bloating. If you had a lower endoscopy (such as a colonoscopy or flexible sigmoidoscopy) you may notice spotting of blood in your stool or on the toilet paper. If you underwent a bowel prep for your procedure, you may not have a normal bowel movement for a few days.  Please Note:  You might notice some irritation and congestion in your nose or some drainage.  This is from the oxygen used during your procedure.  There is no need for concern and it should clear up in a day or so.  SYMPTOMS TO REPORT IMMEDIATELY:  Following upper endoscopy (EGD)  Vomiting of blood or coffee ground material  New chest pain or pain under the shoulder blades  Painful or persistently difficult swallowing  New shortness of breath  Fever of 100F or higher  Black, tarry-looking stools  Discharge to home Await pathology results Return to GI clinic in 6-8 weeks  For urgent or emergent issues, a gastroenterologist can be reached at any hour by calling [phone]. Do not use MyChart messaging for urgent concerns.    DIET:  We do recommend a small meal at first, but then you may proceed to your regular diet.  Drink plenty of fluids but you should avoid alcoholic beverages for 24 hours.  ACTIVITY:  You should plan to take it  easy for the rest of today and you should NOT DRIVE or use heavy machinery until tomorrow (because of the sedation medicines used during the test).    FOLLOW UP: Our staff will call the number listed on your records the next business day following your procedure.  We will call around 7:15- 8:00 am to check on you and address any questions or concerns that you may have regarding the information given to you following your procedure. If we do not reach you, we will leave a message.     If any biopsies were taken you will be contacted by phone or by letter within the next 1-3 weeks.  Please call us  at [phone] if you have not heard about the biopsies in 3 weeks.    SIGNATURES/CONFIDENTIALITY: You and/or your care partner have signed paperwork which will be entered into your electronic medical record.  These signatures attest to the fact that that the information above on your After Visit Summary has been reviewed and is understood.  Full responsibility of the confidentiality of this discharge information lies with you and/or your care-partner.

## 2024-03-25 ENCOUNTER — Telehealth: Payer: Self-pay

## 2024-03-25 NOTE — Telephone Encounter (Signed)
 Attempted to reach patient for follow up phone call. No answer, left voicemail to contact Dr. Andy office with any questions or concerns.

## 2024-03-26 LAB — SURGICAL PATHOLOGY

## 2024-03-27 ENCOUNTER — Ambulatory Visit: Payer: Self-pay | Admitting: Pediatrics

## 2024-04-30 ENCOUNTER — Emergency Department (HOSPITAL_BASED_OUTPATIENT_CLINIC_OR_DEPARTMENT_OTHER)

## 2024-04-30 ENCOUNTER — Emergency Department (HOSPITAL_BASED_OUTPATIENT_CLINIC_OR_DEPARTMENT_OTHER)
Admission: EM | Admit: 2024-04-30 | Discharge: 2024-04-30 | Disposition: A | Discharge status: Home / Self Care | Attending: Emergency Medicine | Admitting: Emergency Medicine

## 2024-04-30 ENCOUNTER — Encounter (HOSPITAL_BASED_OUTPATIENT_CLINIC_OR_DEPARTMENT_OTHER): Payer: Self-pay | Admitting: Emergency Medicine

## 2024-04-30 ENCOUNTER — Other Ambulatory Visit: Payer: Self-pay

## 2024-04-30 ENCOUNTER — Other Ambulatory Visit (HOSPITAL_BASED_OUTPATIENT_CLINIC_OR_DEPARTMENT_OTHER): Payer: Self-pay

## 2024-04-30 DIAGNOSIS — Y92410 Unspecified street and highway as the place of occurrence of the external cause: Secondary | ICD-10-CM | POA: Diagnosis not present

## 2024-04-30 DIAGNOSIS — S8991XA Unspecified injury of right lower leg, initial encounter: Secondary | ICD-10-CM | POA: Diagnosis present

## 2024-04-30 DIAGNOSIS — R519 Headache, unspecified: Secondary | ICD-10-CM | POA: Insufficient documentation

## 2024-04-30 DIAGNOSIS — M25522 Pain in left elbow: Secondary | ICD-10-CM | POA: Diagnosis not present

## 2024-04-30 DIAGNOSIS — S8011XA Contusion of right lower leg, initial encounter: Secondary | ICD-10-CM | POA: Insufficient documentation

## 2024-04-30 MED ORDER — OXYCODONE HCL 5 MG PO TABS
5.0000 mg | ORAL_TABLET | Freq: Once | ORAL | Status: AC
  Administered 2024-04-30: 5 mg via ORAL
  Filled 2024-04-30: qty 1

## 2024-04-30 MED ORDER — CYCLOBENZAPRINE HCL 10 MG PO TABS
10.0000 mg | ORAL_TABLET | Freq: Two times a day (BID) | ORAL | 0 refills | Status: AC | PRN
  Filled 2024-04-30: qty 20, 10d supply, fill #0

## 2024-04-30 MED ORDER — ACETAMINOPHEN 500 MG PO TABS
1000.0000 mg | ORAL_TABLET | Freq: Once | ORAL | Status: AC
  Administered 2024-04-30: 1000 mg via ORAL
  Filled 2024-04-30: qty 2

## 2024-04-30 NOTE — ED Notes (Signed)

## 2024-04-30 NOTE — Discharge Instructions (Signed)
 Recommend Tylenol  1000 mg every 6 hours and ibuprofen 400 mg every 8 hours as needed for pain.  Use muscle relaxant Flexeril  for any muscle spasms.  Recommend ice as well.  Follow-up with primary care if not improving.  I do think you have bone bruises that should heal well with time.
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
## 2024-05-01 ENCOUNTER — Encounter (HOSPITAL_BASED_OUTPATIENT_CLINIC_OR_DEPARTMENT_OTHER): Payer: Self-pay | Admitting: Emergency Medicine

## 2024-05-01 ENCOUNTER — Other Ambulatory Visit: Payer: Self-pay

## 2024-05-01 ENCOUNTER — Emergency Department (HOSPITAL_BASED_OUTPATIENT_CLINIC_OR_DEPARTMENT_OTHER)

## 2024-05-01 ENCOUNTER — Emergency Department (HOSPITAL_BASED_OUTPATIENT_CLINIC_OR_DEPARTMENT_OTHER)
Admission: EM | Admit: 2024-05-01 | Discharge: 2024-05-01 | Disposition: A | Discharge status: Home / Self Care | Attending: Emergency Medicine | Admitting: Emergency Medicine

## 2024-05-01 DIAGNOSIS — E860 Dehydration: Secondary | ICD-10-CM | POA: Insufficient documentation

## 2024-05-01 DIAGNOSIS — R002 Palpitations: Secondary | ICD-10-CM | POA: Diagnosis present

## 2024-05-01 LAB — CBC
HCT: 44.4 % (ref 39.0–52.0)
Hemoglobin: 15.4 g/dL (ref 13.0–17.0)
MCH: 32.1 pg (ref 26.0–34.0)
MCHC: 34.7 g/dL (ref 30.0–36.0)
MCV: 92.5 fL (ref 80.0–100.0)
Platelets: 218 K/uL (ref 150–400)
RBC: 4.8 MIL/uL (ref 4.22–5.81)
RDW: 12.1 % (ref 11.5–15.5)
WBC: 7.4 K/uL (ref 4.0–10.5)
nRBC: 0 % (ref 0.0–0.2)

## 2024-05-01 LAB — BASIC METABOLIC PANEL WITH GFR
Anion gap: 16 — ABNORMAL HIGH (ref 5–15)
BUN: 11 mg/dL (ref 6–20)
CO2: 26 mmol/L (ref 22–32)
Calcium: 9.5 mg/dL (ref 8.9–10.3)
Chloride: 99 mmol/L (ref 98–111)
Creatinine, Ser: 1.15 mg/dL (ref 0.61–1.24)
GFR, Estimated: >60 mL/min
Glucose, Bld: 99 mg/dL (ref 70–99)
Potassium: 3.9 mmol/L (ref 3.5–5.1)
Sodium: 141 mmol/L (ref 135–145)

## 2024-05-01 LAB — RESP PANEL BY RT-PCR (RSV, FLU A&B, COVID)  RVPGX2
Influenza A by PCR: NEGATIVE
Influenza B by PCR: NEGATIVE
Resp Syncytial Virus by PCR: NEGATIVE
SARS Coronavirus 2 by RT PCR: NEGATIVE

## 2024-05-01 LAB — URINE DRUG SCREEN
Amphetamines: NEGATIVE
Barbiturates: NEGATIVE
Benzodiazepines: NEGATIVE
Cocaine: NEGATIVE
Fentanyl: NEGATIVE
Methadone Scn, Ur: NEGATIVE
Opiates: NEGATIVE
Tetrahydrocannabinol: POSITIVE — AB

## 2024-05-01 LAB — TROPONIN T, HIGH SENSITIVITY: Troponin T High Sensitivity: <15 ng/L (ref 0–19)

## 2024-05-01 NOTE — ED Notes (Signed)
 Patient transported to X-ray

## 2024-05-01 NOTE — ED Notes (Signed)
 ED Provider at bedside.
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
## 2024-05-01 NOTE — Discharge Instructions (Addendum)
 All your blood work, your x-ray and your EKG today were reassuring.  Your urine test should be back within an hour or 2.  Continue to hydrate and try to avoid excessive alcohol use, ibuprofen products or spicy foods.
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
## 2024-05-18 NOTE — Progress Notes (Unsigned)
 "  [facility] Return Visit   Referring Provider Regino Slater, MD [address] [place],  [place] [address]  Primary Care Provider Koirala, Dibas, MD  Patient Profile: Casey Curtis is a 49 y.o. male who returns to the [facility] for follow-up of the problem(s) noted below.  Problem List: GERD Regurgitation Dysphagia Hiatal hernia History of adenomatous colon polyp Colonic diverticulosis Cholelithiasis  History of Present Illness    Discussed the use of AI scribe software for clinical note transcription with the patient, who gave verbal consent to proceed.  History of Present Illness Mr. Armenti is a 49 year old gentleman with a past medical history noteworthy for polyarteritis nodosa, migraine headache, allergic rhinitis, thoracic radiculopathy prostatitis who returns to the gastroenterology office for multiple gastrointestinal issues as outlined below:   Current GI Meds  Omeprazole 40 mg orally daily  Interval History     GI Review of Symptoms Significant for {GIROS:50592}. Otherwise negative.  General Review of Systems  Review of systems is significant for the pertinent positives and negatives as listed per the HPI.  Full ROS is otherwise negative.  Past Medical History   Past Medical History:  Diagnosis Date   Arthritis    Costochondritis    Depression    Diverticulosis    Gall stones    Pinched nerve    In back   Vasculitis      Past Surgical History   Past Surgical History:  Procedure Laterality Date   KNEE SURGERY Bilateral    2008 2013     Allergies and Medications   Allergies[1] @MEDSTODAY @  Family His   Family History  Problem Relation Age of Onset   Memory loss Father    Rectal cancer Maternal Uncle    Colon cancer Maternal Uncle    Arthritis Other    Hyperlipidemia Other    Stroke Other    Hypertension Other    Mental illness Other    Diabetes Other    Colon polyps Neg Hx     Esophageal cancer Neg Hx    Stomach cancer Neg Hx    GI Specific Family History: {gifamhx:50061}   Social History   Social History[2] Clair reports that he has quit smoking. His smoking use included cigarettes. He has never used smokeless tobacco. He reports current alcohol use. He reports that he does not currently use drugs after having used the following drugs: Marijuana.  Vital Signs and Physical Examination   There were no vitals filed for this visit. There is no height or weight on file to calculate BMI.    General: Well developed, well nourished, no acute distress Head: Normocephalic and atraumatic Eyes: Sclerae anicteric, EOMI Ears: Normal auditory acuity Mouth: No deformities or lesions noted Lungs: Clear throughout to auscultation Heart: Regular rate and rhythm; No murmurs, rubs or bruits Abdomen: Soft, non tender and non distended. No masses, hepatosplenomegaly or hernias noted. Normal Bowel sounds Rectal: Musculoskeletal: Symmetrical with no gross deformities  Pulses:  Normal pulses noted Extremities: No edema or deformities noted Neurological: Alert oriented x 4, grossly nonfocal Psychological:  Alert and cooperative. Normal mood and affect   Review of Data   The following data was reviewed at the time of this encounter:   Laboratory Studies      Latest Ref Rng & Units 05/01/2024    3:30 PM 03/20/2023    3:46 PM 07/19/2022    9:51 AM  CBC  WBC 4.0 - 10.5 K/uL 7.4  5.4  5.5   Hemoglobin 13.0 - 17.0 g/dL 84.5  85.7  85.2   Hematocrit 39.0 - 52.0 % 44.4  43.1  43.5   Platelets 150 - 400 K/uL 218  236.0  232.0     No results found for: LIPASE    Latest Ref Rng & Units 05/01/2024    3:30 PM 03/20/2023    3:46 PM 07/19/2022    9:51 AM  CMP  Glucose 70 - 99 mg/dL 99  90  98   BUN 6 - 20 mg/dL 11  9  13    Creatinine 0.61 - 1.24 mg/dL 8.84  8.98  8.90   Sodium 135 - 145 mmol/L 141  140  142   Potassium 3.5 - 5.1 mmol/L 3.9  4.2  4.2   Chloride 98  - 111 mmol/L 99  102  107   CO2 22 - 32 mmol/L 26  29  29    Calcium 8.9 - 10.3 mg/dL 9.5  9.2  9.1   Total Protein 6.0 - 8.3 g/dL  6.9  6.5   Total Bilirubin 0.2 - 1.2 mg/dL  0.5  0.5   Alkaline Phos 39 - 117 U/L  54  49   AST 0 - 37 U/L  17  16   ALT 0 - 53 U/L  19  26      Imaging Studies  CT angio 06/20/2023 1. Cholelithiasis. 2. Small benign capillary hemangioma in the posteromedial aspect of segment 6 of the liver. No further imaging follow-up is recommended. 3. Mild colonic diverticulosis.   GI Procedures and Studies  EGD 03/24/2024 - Normal upper third of esophagus and middle third of esophagus. - Salmon-colored mucosa suspicious for short-segment Barrett's esophagus. Biopsied.  - Normal gastric body, antrum, cardia and gastric fundus. Biopsied.  - Small hiatal hernia.  - Normal first portion of the duodenum and second portion of the duodenum.  - Biopsies were taken with a cold forceps for evaluation of eosinophilic esophagitis.  Path: Mild reflux in lower esophagus, no intestinal metaplasia, no EOE; mild chronic inactive gastritis, negative for H. pylori  Colonoscopy 06/15/2023 - 6 mm descending colon polyp -tubular adenoma - Left colon diverticulosis   Clinical Impression  It is my clinical impression that Mr. Rinn is a 49 y.o. male with;  ***  Plan  *** *** *** *** ***   Planned Follow Up No follow-ups on file.  The patient or caregiver verbalized understanding of the material covered, with no barriers to understanding. All questions were answered. Patient or caregiver is agreeable with the plan outlined above.    It was a pleasure to see Kamal.  If you have any questions or concerns regarding this evaluation, do not hesitate to contact me.  Inocente Hausen, MD [facility]      [1]  Allergies Allergen Reactions   Amoxicillin-Pot Clavulanate Nausea And Vomiting and Nausea Only  [2]  Social History Tobacco Use   Smoking status: Former     Types: Cigarettes   Smokeless tobacco: Never  Vaping Use   Vaping status: Never Used  Substance Use Topics   Alcohol use: Yes    Comment: bourbon on weekends   Drug use: Not Currently    Types: Marijuana    Comment: THC-A Delta - smoked   "

## 2024-05-20 ENCOUNTER — Ambulatory Visit: Admitting: Pediatrics

## 2024-06-06 ENCOUNTER — Other Ambulatory Visit (HOSPITAL_COMMUNITY): Payer: Self-pay

## 2024-06-19 ENCOUNTER — Ambulatory Visit: Admitting: Pediatrics
